# Patient Record
Sex: Male | Born: 1980 | Hispanic: Yes | Marital: Single | Smoking: Never smoker
Health system: Southern US, Community
[De-identification: ages and names within clinical notes are randomized; demographics above are authoritative.]

## PROBLEM LIST (undated history)

## (undated) ENCOUNTER — Ambulatory Visit (HOSPITAL_COMMUNITY): Admission: EM | Payer: PRIVATE HEALTH INSURANCE | Source: Home / Self Care

---

## 2010-09-10 ENCOUNTER — Emergency Department (HOSPITAL_COMMUNITY)
Admission: EM | Admit: 2010-09-10 | Discharge: 2010-09-10 | Disposition: A | Discharge status: Home / Self Care | Payer: Self-pay | Attending: Emergency Medicine | Admitting: Emergency Medicine

## 2010-09-10 DIAGNOSIS — R1013 Epigastric pain: Secondary | ICD-10-CM | POA: Insufficient documentation

## 2010-09-10 DIAGNOSIS — K2970 Gastritis, unspecified, without bleeding: Secondary | ICD-10-CM | POA: Insufficient documentation

## 2010-09-12 ENCOUNTER — Emergency Department (HOSPITAL_COMMUNITY): Payer: Self-pay

## 2010-09-12 ENCOUNTER — Emergency Department (HOSPITAL_COMMUNITY)
Admission: EM | Admit: 2010-09-12 | Discharge: 2010-09-12 | Disposition: A | Discharge status: Home / Self Care | Payer: Self-pay | Attending: Emergency Medicine | Admitting: Emergency Medicine

## 2010-09-12 DIAGNOSIS — R110 Nausea: Secondary | ICD-10-CM | POA: Insufficient documentation

## 2010-09-12 DIAGNOSIS — R1013 Epigastric pain: Secondary | ICD-10-CM | POA: Insufficient documentation

## 2010-09-12 LAB — CBC
MCHC: 34.8 g/dL (ref 30.0–36.0)
RDW: 13.2 % (ref 11.5–15.5)
WBC: 17.1 10*3/uL — ABNORMAL HIGH (ref 4.0–10.5)

## 2010-09-12 LAB — COMPREHENSIVE METABOLIC PANEL
ALT: 42 U/L (ref 0–53)
AST: 30 U/L (ref 0–37)
Albumin: 3.7 g/dL (ref 3.5–5.2)
Calcium: 8.5 mg/dL (ref 8.4–10.5)
GFR calc Af Amer: >60 mL/min (ref >60)
Potassium: 4.2 mEq/L (ref 3.5–5.1)
Sodium: 136 mEq/L (ref 135–145)
Total Protein: 7.1 g/dL (ref 6.0–8.3)

## 2010-09-12 LAB — URINALYSIS, ROUTINE W REFLEX MICROSCOPIC
Bilirubin Urine: NEGATIVE
Ketones, ur: NEGATIVE mg/dL
Nitrite: NEGATIVE
Specific Gravity, Urine: 1.017 (ref 1.005–1.030)
Urobilinogen, UA: 0.2 mg/dL (ref 0.0–1.0)
pH: 6 (ref 5.0–8.0)

## 2010-09-12 LAB — DIFFERENTIAL
Basophils Absolute: 0 10*3/uL (ref 0.0–0.1)
Basophils Relative: 0 % (ref 0–1)
Eosinophils Relative: 4 % (ref 0–5)
Monocytes Absolute: 1.1 10*3/uL — ABNORMAL HIGH (ref 0.1–1.0)
Neutro Abs: 11.7 10*3/uL — ABNORMAL HIGH (ref 1.7–7.7)

## 2010-09-12 LAB — LIPASE, BLOOD: Lipase: 27 U/L (ref 11–59)

## 2010-09-12 MED ORDER — IOHEXOL 300 MG/ML  SOLN
80.0000 mL | Freq: Once | INTRAMUSCULAR | Status: AC | PRN
  Administered 2010-09-12: 80 mL via INTRAVENOUS

## 2010-09-14 ENCOUNTER — Emergency Department (HOSPITAL_COMMUNITY): Payer: Medicaid Other

## 2010-09-14 ENCOUNTER — Inpatient Hospital Stay (HOSPITAL_COMMUNITY)
Admission: EM | Admit: 2010-09-14 | Discharge: 2010-09-16 | DRG: 394 | Disposition: A | Discharge status: Home / Self Care | Payer: Medicaid Other | Attending: Internal Medicine | Admitting: Internal Medicine

## 2010-09-14 DIAGNOSIS — D72829 Elevated white blood cell count, unspecified: Secondary | ICD-10-CM | POA: Diagnosis present

## 2010-09-14 DIAGNOSIS — I880 Nonspecific mesenteric lymphadenitis: Secondary | ICD-10-CM | POA: Diagnosis present

## 2010-09-14 DIAGNOSIS — E871 Hypo-osmolality and hyponatremia: Secondary | ICD-10-CM | POA: Diagnosis present

## 2010-09-14 DIAGNOSIS — K6389 Other specified diseases of intestine: Principal | ICD-10-CM | POA: Diagnosis present

## 2010-09-14 LAB — COMPREHENSIVE METABOLIC PANEL
ALT: 43 U/L (ref 0–53)
Alkaline Phosphatase: 127 U/L — ABNORMAL HIGH (ref 39–117)
CO2: 25 mEq/L (ref 19–32)
Calcium: 8.9 mg/dL (ref 8.4–10.5)
GFR calc non Af Amer: >60 mL/min (ref >60)
Glucose, Bld: 99 mg/dL (ref 70–99)
Potassium: 3.6 mEq/L (ref 3.5–5.1)
Sodium: 133 mEq/L — ABNORMAL LOW (ref 135–145)

## 2010-09-14 LAB — CBC
HCT: 42.5 % (ref 39.0–52.0)
Hemoglobin: 14.9 g/dL (ref 13.0–17.0)
MCHC: 35.1 g/dL (ref 30.0–36.0)
MCV: 88.7 fL (ref 78.0–100.0)

## 2010-09-14 LAB — LIPASE, BLOOD: Lipase: 25 U/L (ref 11–59)

## 2010-09-14 LAB — URINALYSIS, ROUTINE W REFLEX MICROSCOPIC
Bilirubin Urine: NEGATIVE
Glucose, UA: NEGATIVE mg/dL
Hgb urine dipstick: NEGATIVE
Nitrite: NEGATIVE
Specific Gravity, Urine: 1.01 (ref 1.005–1.030)
pH: 7 (ref 5.0–8.0)

## 2010-09-14 LAB — DIFFERENTIAL
Basophils Absolute: 0 10*3/uL (ref 0.0–0.1)
Lymphocytes Relative: 17 % (ref 12–46)
Lymphs Abs: 2.5 10*3/uL (ref 0.7–4.0)
Monocytes Absolute: 0.8 10*3/uL (ref 0.1–1.0)
Neutro Abs: 10.7 10*3/uL — ABNORMAL HIGH (ref 1.7–7.7)

## 2010-09-15 ENCOUNTER — Emergency Department (HOSPITAL_COMMUNITY): Payer: Medicaid Other

## 2010-09-15 ENCOUNTER — Inpatient Hospital Stay (HOSPITAL_COMMUNITY): Payer: Medicaid Other

## 2010-09-15 DIAGNOSIS — R1013 Epigastric pain: Secondary | ICD-10-CM

## 2010-09-15 DIAGNOSIS — I880 Nonspecific mesenteric lymphadenitis: Secondary | ICD-10-CM

## 2010-09-15 DIAGNOSIS — K6389 Other specified diseases of intestine: Secondary | ICD-10-CM

## 2010-09-15 LAB — CBC
Platelets: 415 10*3/uL — ABNORMAL HIGH (ref 150–400)
RDW: 13.3 % (ref 11.5–15.5)
WBC: 11.4 10*3/uL — ABNORMAL HIGH (ref 4.0–10.5)

## 2010-09-15 LAB — HEPATIC FUNCTION PANEL
ALT: 37 U/L (ref 0–53)
AST: 25 U/L (ref 0–37)
Indirect Bilirubin: 0.5 mg/dL (ref 0.3–0.9)
Total Protein: 6.2 g/dL (ref 6.0–8.3)

## 2010-09-15 LAB — DIFFERENTIAL
Basophils Absolute: 0 10*3/uL (ref 0.0–0.1)
Eosinophils Absolute: 0.5 10*3/uL (ref 0.0–0.7)
Eosinophils Relative: 5 % (ref 0–5)

## 2010-09-15 LAB — BASIC METABOLIC PANEL
BUN: 5 mg/dL — ABNORMAL LOW (ref 6–23)
GFR calc non Af Amer: >60 mL/min (ref >60)
Potassium: 3.9 mEq/L (ref 3.5–5.1)
Sodium: 137 mEq/L (ref 135–145)

## 2010-09-16 ENCOUNTER — Inpatient Hospital Stay (HOSPITAL_COMMUNITY): Payer: Medicaid Other

## 2010-09-16 LAB — COMPREHENSIVE METABOLIC PANEL
ALT: 34 U/L (ref 0–53)
BUN: 5 mg/dL — ABNORMAL LOW (ref 6–23)
CO2: 28 mEq/L (ref 19–32)
Calcium: 8.7 mg/dL (ref 8.4–10.5)
Creatinine, Ser: 0.65 mg/dL (ref 0.4–1.5)
GFR calc non Af Amer: >60 mL/min (ref >60)
Glucose, Bld: 80 mg/dL (ref 70–99)
Sodium: 137 mEq/L (ref 135–145)
Total Protein: 6.2 g/dL (ref 6.0–8.3)

## 2010-09-16 LAB — DIFFERENTIAL
Basophils Absolute: 0 10*3/uL (ref 0.0–0.1)
Eosinophils Relative: 10 % — ABNORMAL HIGH (ref 0–5)
Lymphocytes Relative: 39 % (ref 12–46)
Lymphs Abs: 3.3 10*3/uL (ref 0.7–4.0)
Monocytes Absolute: 0.6 10*3/uL (ref 0.1–1.0)
Monocytes Relative: 7 % (ref 3–12)
Neutro Abs: 3.8 10*3/uL (ref 1.7–7.7)

## 2010-09-16 LAB — CBC
HCT: 42 % (ref 39.0–52.0)
Hemoglobin: 14.7 g/dL (ref 13.0–17.0)
MCH: 31.4 pg (ref 26.0–34.0)
MCHC: 35 g/dL (ref 30.0–36.0)
MCV: 89.7 fL (ref 78.0–100.0)
RDW: 13.6 % (ref 11.5–15.5)

## 2010-09-16 LAB — MAGNESIUM: Magnesium: 1.8 mg/dL (ref 1.5–2.5)

## 2010-09-16 LAB — PHOSPHORUS: Phosphorus: 4.2 mg/dL (ref 2.3–4.6)

## 2010-09-16 LAB — T4, FREE: Free T4: 1.4 ng/dL (ref 0.80–1.80)

## 2010-09-24 NOTE — Consult Note (Signed)
NAMEKENAI, FLUEGEL                  ACCOUNT NO.:  [id number]  MEDICAL RECORD NO.:  [id number]           PATIENT TYPE:  I  LOCATION:  5524                         FACILITY:  [facility]  PHYSICIAN:  Riyanna Crutchley A. Ryheem Jay, M.D.DATE OF BIRTH:  [birth year]  DATE OF CONSULTATION: DATE OF DISCHARGE:                                CONSULTATION   PHYSICIAN REQUESTING CONSULTATION:  Dr. Clarene Duke.  REASON FOR CONSULTATION:  Abdominal pain, history of right colon pneumatosis, and mesenteric adenitis  HISTORY OF PRESENT ILLNESS:  The patient is a 30 year old male who was here 2 days ago in the emergency room due to epigastric pain.  CT scan was obtained which showed right colonic pneumatosis and this analysis was felt to be a benign finding and mesenteric adenitis.  He was sent home on Carafate and some other GI cocktail meds.  He returned with continued epigastric pain.  The pain is located in his epigastrium which is at the base of the sternum.  Pain is constant in nature.  Made worse with eating.  Also nausea noted.  The pain is probably 7 or 8/10. Repeat plain films today showed pneumatosis of right colon which is stable with no free air.  I was asked to consult with respect to his pneumatosis and his abdominal pain.  He denies any right lower quadrant pain, any back pain, or any change in bowel or bladder function.  PAST MEDICAL HISTORY:  None.  PAST SURGICAL HISTORY:  None.  FAMILY HISTORY:  Noncontributory.  SOCIAL HISTORY:  Denies tobacco or alcohol use.  MEDICATIONS:  Currently Carafate.  REVIEW OF SYSTEMS:  As above, otherwise negative x15 points.  ALLERGIES:  No known drug allergies.  PHYSICAL EXAMINATION:  VITAL SIGNS:  Temperature 97, pulse 79, blood pressure 110/64, respiratory rate 20. GENERAL APPEARANCE:  Male in no apparent distress. HEENT:  No jaundice.  Oropharynx moist. NECK:  Supple, nontender.  Trachea midline. PULMONARY:  Lung sounds are clear.  Chest wall motion  normal. CARDIOVASCULAR:  Regular rate and rhythm without rub, murmur, or gallop. ABDOMEN:  Tenderness is right in his epigastrium.  Negative Murphy sign. No right lower quadrant tenderness.  No tenderness over McBurney's point.  No hernia.  No mass or phlegmon. EXTREMITIES:  No clubbing, cyanosis, or edema.  Extremities are warm and well-perfused. NEURO:  Glasgow coma scale is 15.  Motor and sensory function are grossly intact.  Abdominal and pelvic CT scan reviewed from September 12, 2010 which shows pneumatosis to the right colon.  There is significant mesenteric adenitis.  Appendix is normal.  Gallbladder looked normal.  No free fluid.  No ascites.  White count of 14,200 today, was 17,000 on the 18th, hemoglobin 14.9, platelet count 445,000, no left shift.  Sodium 133, potassium 3.6, chloride 101, CO2 25, BUN 7, creatinine 0.54, glucose 99.  IMPRESSION: 1. Right colon pneumatosis. 2. Mesenteric adenitis 3. Epigastric abdominal pain.  DISCUSSION:  I think the pneumatosis is an incidental finding, is of no clinical significance at this point.  He does have mesenteric adenitis. As far as his epigastric pain, I think it requires further workup  with a gastroenterologist to exclude peptic ulcer disease, gastritis, duodenitis, or potentially biliary tract pain.  He is negative though in his right upper quadrant on examination.  Recommend medicine consultation, GI consultation.  At this point in time, I see no surgical intervention necessary.     Nilda Keathley A. Adalaya Irion, M.D.     TAC/MEDQ  D:  09/15/2010  T:  09/15/2010  Job:  [id number]  Electronically Signed by Harriette Bouillon M.D. on 09/24/2010 07:31:19 AM

## 2010-09-25 NOTE — Discharge Summary (Signed)
[name], ANZALONE                  ACCOUNT NO.:  [id number]  MEDICAL RECORD NO.:  [id number]           PATIENT TYPE:  I  LOCATION:  5524                         FACILITY:  [facility]  PHYSICIAN:  Rock Nephew, MD       DATE OF BIRTH:  1981-05-13  DATE OF ADMISSION:  09/14/2010 DATE OF DISCHARGE:  09/16/2010                        DISCHARGE SUMMARY - REFERRING   The patient has no primary care physician.  He is urged to establish care with a primary care physician.  Follow up with the primary care physician in 1 week.  DISCHARGE DIAGNOSES: 1. Mesenteric adenitis, resolving. 2. Pneumatosis intestinalis. 3. Abdominal pain from mesenteric adenitis and pneumatosis     intestinalis, resolved. 4. Leukocytosis, resolved.  The patient's discharge medications are as follows:  Vicodin 1 tablet by mouth every 6 hours as needed for pain.  The patient's diet is regular.  FOLLOWUP:  The patient should follow up with Dr. Leone Payor in about 2 weeks.  They will try to arrange an outpatient followup for the patient. If Dr. Marvell Fuller office has not called the patient, the patient is instructed to call them as the patient does need to follow up with them.  CONSULTATIONS ON THIS CASE:  Iva Boop, MD, Clementeen Graham, [facility].  PROCEDURES PERFORMED:  The patient had a CT scan of the abdomen and pelvis before the admission on September 12, 2010 in the emergency department, which showed circumferential pneumatosis involving the right colon, likely benign etiology.  There is no associated bowel wall thickening or extraluminal air-fluid collection.  Too numerous prominent mesenteric lymph nodes suspicious for mesenteric adenitis.  Early proliferative process cannot be excluded and requires clinical correlation.  Mild periportal edema within the liver; this can be seen with aggressive intravenous hydration.  The patient had an acute abdominal series on September 14, 2010, which showed stable right  colonic pneumatosis.  Acute abdominal series on September 15, 2010 showed nonobstructive bowel gas pattern.  No active cardiopulmonary disease. The patient had two-view abdominal x-ray on September 16, 2010, which showed nonspecific, nonobstructive bowel-gas pattern.  No evidence of pneumoperitoneum.  BRIEF HISTORY OF PRESENT ILLNESS:  This is a 30 year old male, mainly Spanish-speaking presents to the emergency department complaining of epigastric abdominal pain radiating to the back for the past 5 days.  He states that the patient is about 10/10 intensity.  The patient has been unrelieved by any therapy.  The patient went to the emergency department 3 times this week.  HOSPITAL COURSE: 1. Mesenteric adenitis.  The patient had multiple radiological     studies.  The patient initially was n.p.o. then on a clear liquid     diet.  The patient was seen by [facility] and was a nonspecific     finding.  The patient was initially placed on Flagyl; however,     speaking to Dr. Leone Payor, he did not think that the patient needed     any Flagyl or any PPIs. 2. Pneumatosis intestinalis.  Initially was thought it might be     infectious; however, later Dr. Leone Payor did not think it was  infectious or was a incidental finding. 3. Abdominal pain.  Abdominal pain was most likely secondary to     mesenteric adenitis and pneumatosis intestinalis.  Abdominal pain     has resolved, and the patient is deemed ready for discharge. 4. Leukocytosis.  The patient initially had some mild leukocytosis on     admission, 14.2.  Currently, the patient's WBC count is 8.6.     Rock Nephew, MD     NH/MEDQ  D:  09/16/2010  T:  09/16/2010  Job:  [id number]  cc:   Iva Boop, MD,FACG  Electronically Signed by Rock Nephew MD on 09/25/2010 09:00:25 PM

## 2010-10-06 NOTE — H&P (Signed)
[name], [name]                  ACCOUNT NO.:  [id number]  MEDICAL RECORD NO.:  [id number]           PATIENT TYPE:  I  LOCATION:  5524                         FACILITY:  [facility]  PHYSICIAN:  Della Goo, M.D. DATE OF BIRTH:  05-05-1981  DATE OF ADMISSION:  09/15/2010 DATE OF DISCHARGE:                             HISTORY & PHYSICAL   PRIMARY CARE PHYSICIAN:  Unassigned.  Please note that a translation phone was used.  The patient is Spanish speaking mainly with limited [place].  He is a 30 year old male who presents to the emergency department with complaints of epigastric abdominal pain radiating into the back for the past 5 days.  He states he has had 10/10 pain.  The pain has been burning pain and has been unrelieved by any therapy that has been given in the emergency department.  The patient has been seen 3 times this week.  He was first seen on Monday, then Wednesday and returned today on September 14, 2010.  He was evaluated in the emergency department and referred for medical admission after his pain was unrelieved.  In the emergency department, the patient had a repeat acute abdominal series performed, results of which revealed stable right colonic pneumatosis.  A CAT scan of the abdomen had been performed on Wednesday, September 12, 2010, which revealed the right-sided colonic pneumatosis.  A general surgery consultation was placed by the EDP and Dr. Luisa Hart was the consultant on-call who saw the patient in the emergency department. There was no evidence of free air on the imaging studies.  The patient has no history of surgery or instrumentation.  The patient also denied having any symptoms of nausea, vomiting, or diarrhea.  He denies having any black tarry stools and denies having any hematemesis.  He also denies having any fevers or chills.  The patient states that he previously had been healthy up until all this.  PAST MEDICAL HISTORY:  None.  PAST SURGICAL HISTORY:   None.  MEDICATIONS:  None.  ALLERGIES:  No known drug allergies.  SOCIAL HISTORY:  The patient is married.  He is a nonsmoker, nondrinker. Denies any illicit drug usage.  FAMILY HISTORY:  Negative for coronary artery disease, hypertension, diabetes, and cancer.  REVIEW OF SYSTEMS:  Pertinent as mentioned above in the HPI.  All other organ systems are negative.  PHYSICAL EXAMINATION FINDINGS:  GENERAL:  This is a 30 year old well- nourished, well-developed Hispanic male who is in no visible discomfort or acute distress currently. VITAL SIGNS:  Temperature 98.2, blood pressure 100/60, heart rate 59-79, respirations 20, and O2 sats 100%. HEENT:  Normocephalic and atraumatic.  Pupils are equally round and reactive to light.  Extraocular movements are intact.  Funduscopic benign.  There is no scleral icterus.  Nares are patent bilaterally. Oropharynx is clear. NECK:  Supple.  Full range of motion.  No thyromegaly, adenopathy, or jugular venous distention. CARDIOVASCULAR:  Regular rate and rhythm.  No murmurs, gallops, or rubs appreciated.  Normal S1 and S2. LUNGS:  Clear to auscultation bilaterally.  No rales, rhonchi, or wheezes.  Chest wall without any tenderness.  Normal  excursion which is symmetric and breathing is unlabored. ABDOMEN:  Positive bowel sounds, soft, mildly tender in the epigastrium, but no rebound, no guarding.  No hepatosplenomegaly.  No Murphy sign. EXTREMITIES:  Without cyanosis, clubbing, or edema. NEUROLOGIC:  Nonfocal.  LABORATORY STUDIES:  White blood cell count 14.2, hemoglobin 14.9, hematocrit 42.5, platelets 445, and neutrophils 75%.  Sodium 133, potassium 3.6, chloride 101, CO2 of 25, BUN 7, creatinine 0.54, and glucose is 99.  IMAGING STUDIES:  As mentioned above in the HPI.  ASSESSMENT:  This is a 30 year old male being admitted with: 1. Epigastric abdominal pain. 2. Colonic pneumatosis. 3. Mesenteric adenitis on CT scan. 4. Leukocytosis. 5.  Mild hyponatremia.  PLAN:  The patient will be admitted to Med Surgery Area.  Pain control therapy has been ordered along with clear liquids for a diet and IV fluids have been ordered for maintenance therapy.  The patient will be placed on IV Protonix therapy at this time and a repeat abdominal series will be performed in the a.m. and General Surgery will continue to follow this patient.  SCDs have been ordered for DVT prophylaxis.  At this point, the findings of the colonic pneumatosis has been reported as being stable and Radiology and General Surgery are of the opinion that this is a benign/incidental finding.     Della Goo, M.D.     HJ/MEDQ  D:  09/15/2010  T:  09/15/2010  Job:  [id number]  Electronically Signed by Della Goo M.D. on 10/06/2010 07:46:48 PM

## 2011-03-27 DIAGNOSIS — R55 Syncope and collapse: Secondary | ICD-10-CM

## 2012-04-26 IMAGING — CT CT ABD-PELV W/ CM
2 of 4 series · 17 of 46 positions shown, 19 images · IV contrast (APPLIED)
Comparison: None.

CLINICAL DATA: Epigastric pain for several days.Leukocytosis.

CT ABDOMEN AND PELVIS WITH CONTRAST
TECHNIQUE: Multidetector CT imaging of the abdomen and pelvis was
performed following the standard protocol during bolus
administration of intravenous contrast.
Contrast: 80 ml [id number] intravenously.

[Series 2: abd/pelv with 5.0 b31f st · axial · 0.60mm/px · z∈[-808,-384]mm · 14 of 93 slices shown, 16 images]
[im 4/93  soft-tissue]
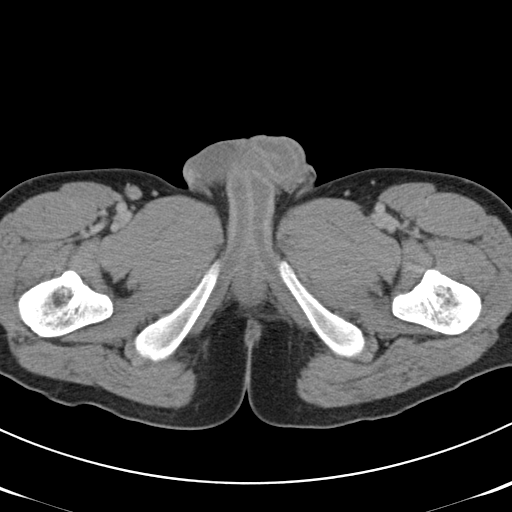
[im 4/93  bone]
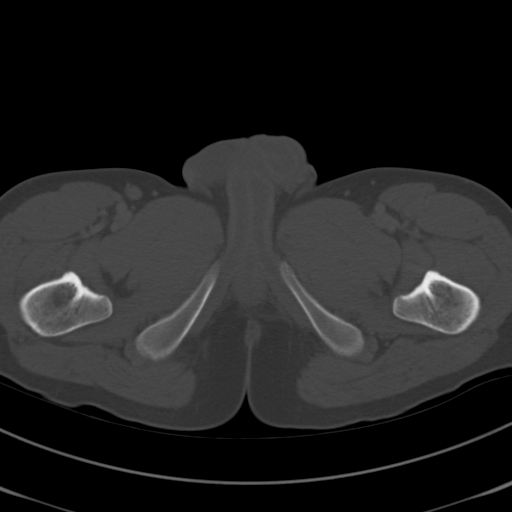
[im 11/93  soft-tissue]
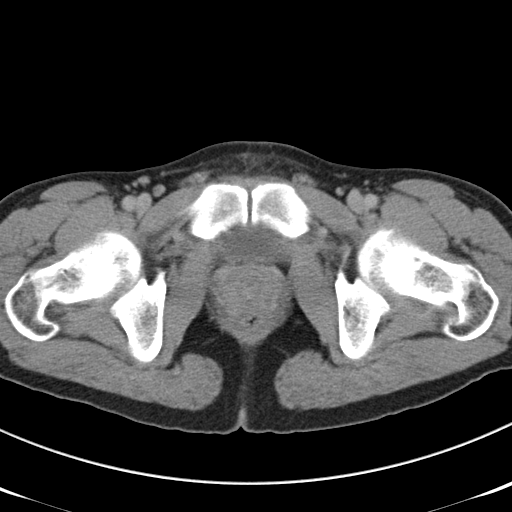
[im 18/93  soft-tissue]
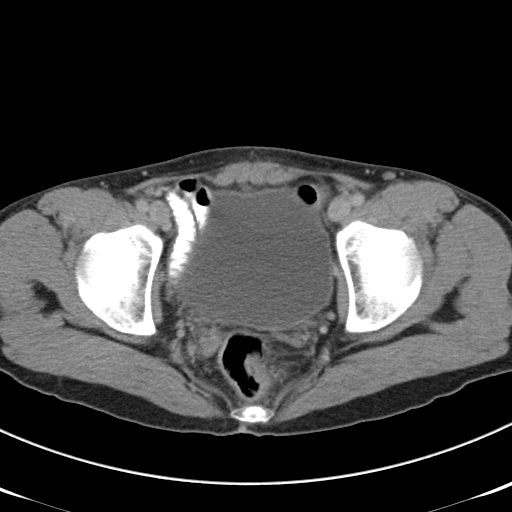
[im 24/93  soft-tissue]
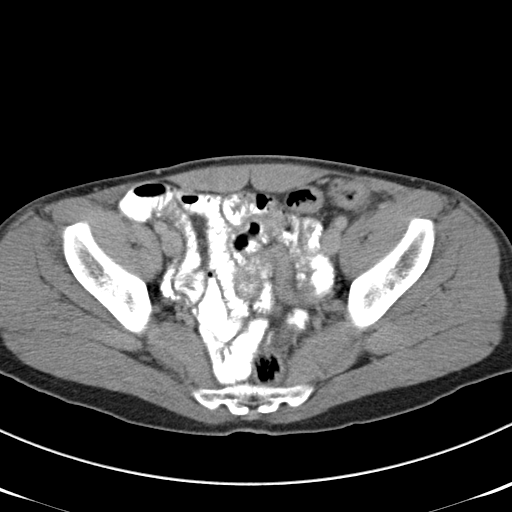
[im 31/93  soft-tissue]
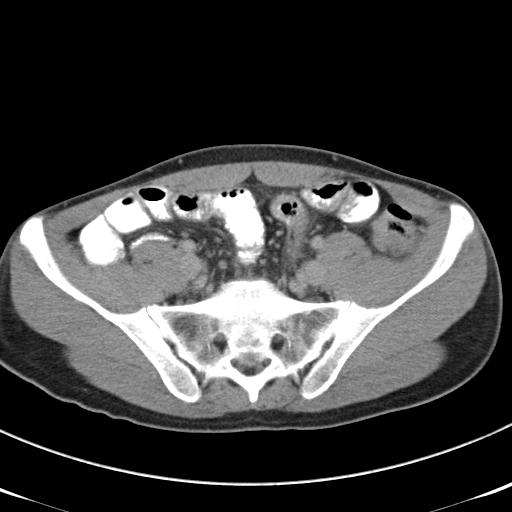
[im 38/93  soft-tissue]
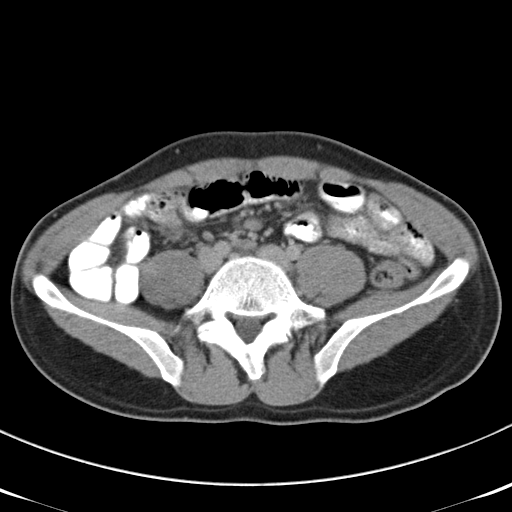
[im 45/93  soft-tissue]
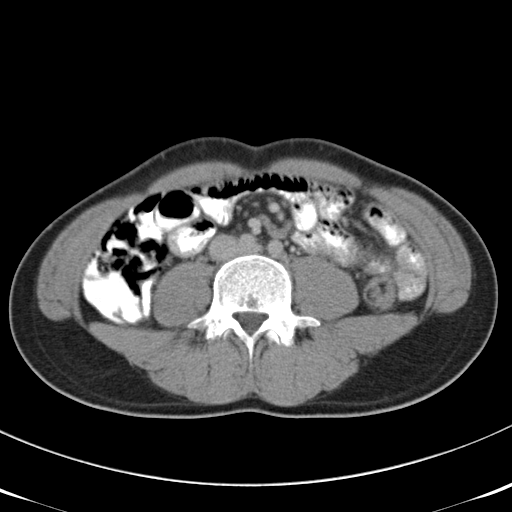
[im 48/93  soft-tissue]
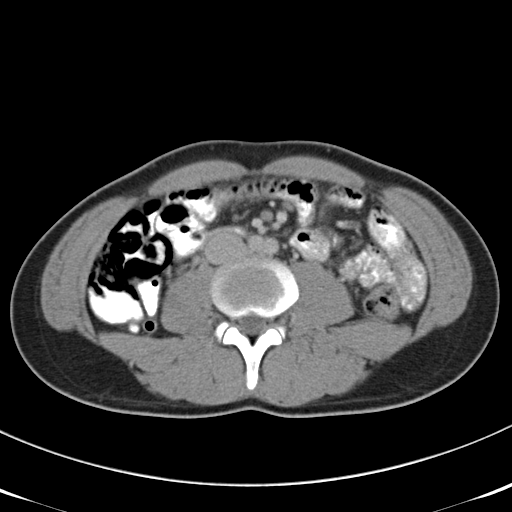
[im 55/93  soft-tissue]
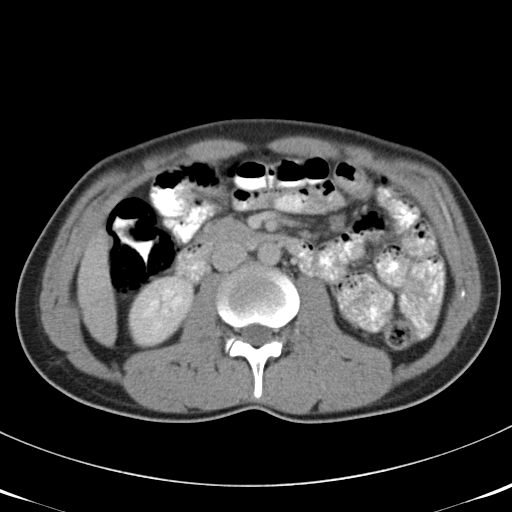
[im 55/93  bone]
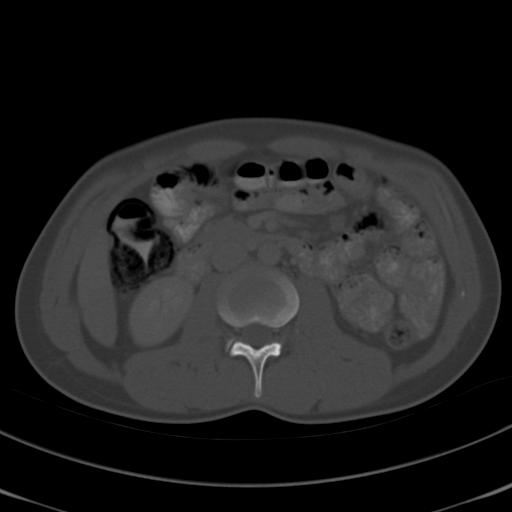
[im 62/93  soft-tissue]
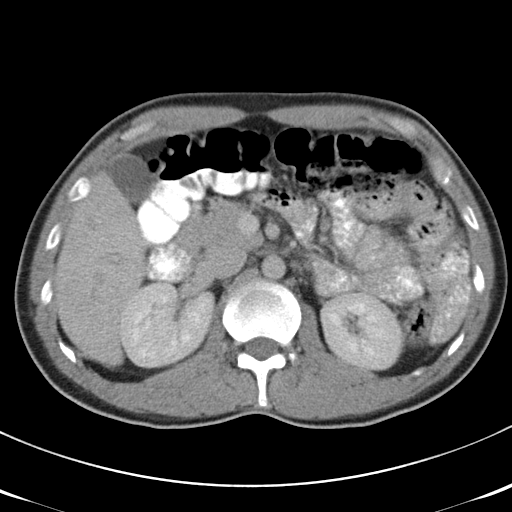
[im 69/93  soft-tissue]
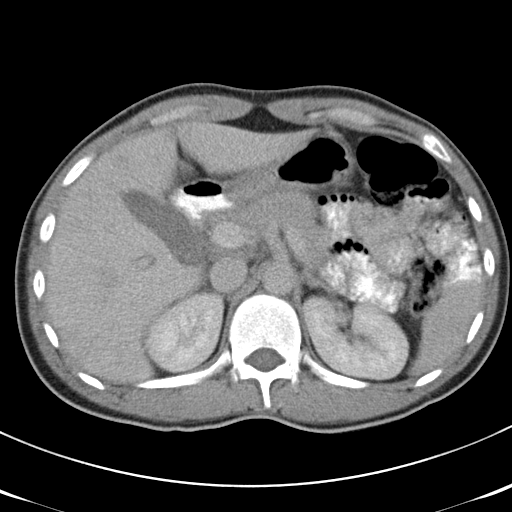
[im 75/93  soft-tissue]
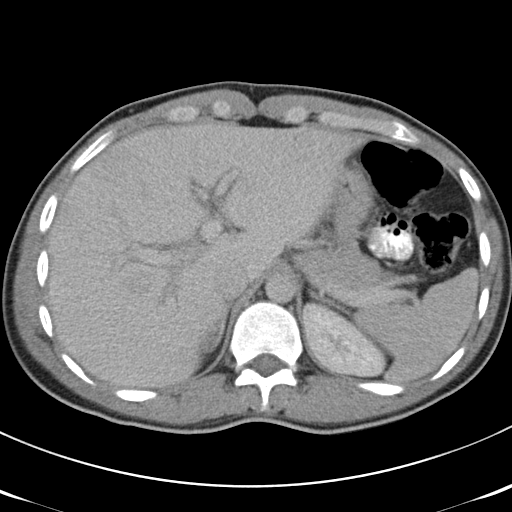
[im 82/93  soft-tissue]
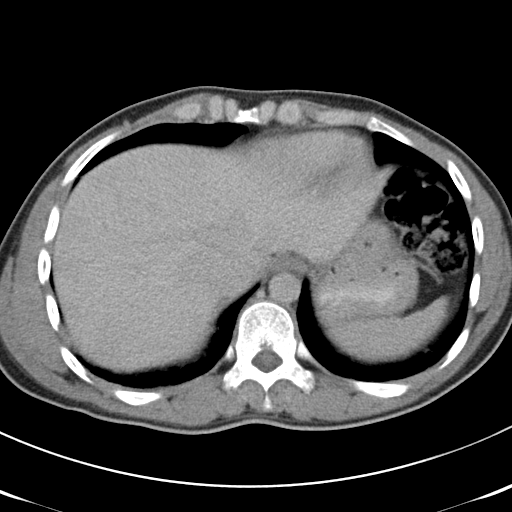
[im 89/93  soft-tissue]
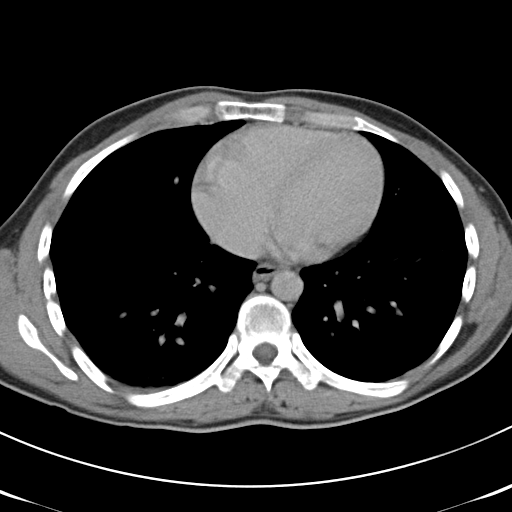

[Series 5: abd/pelv with 2.0 spo st · coronal · 0.90mm/px · 3 of 85 slices shown]
[im 29/85  soft-tissue]
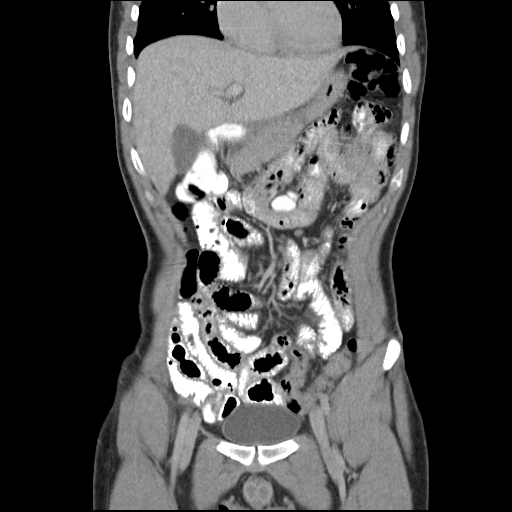
[im 38/85  soft-tissue]
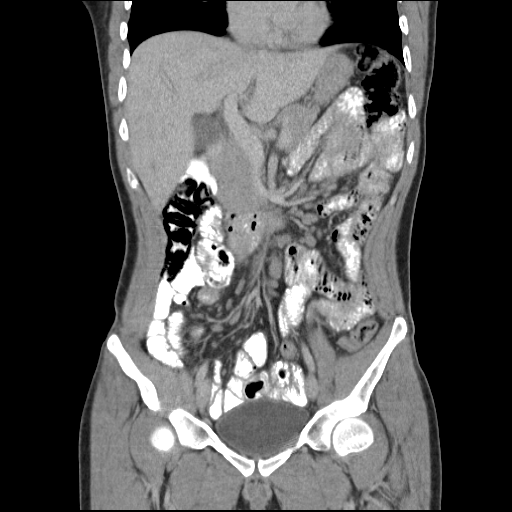
[im 47/85  soft-tissue]
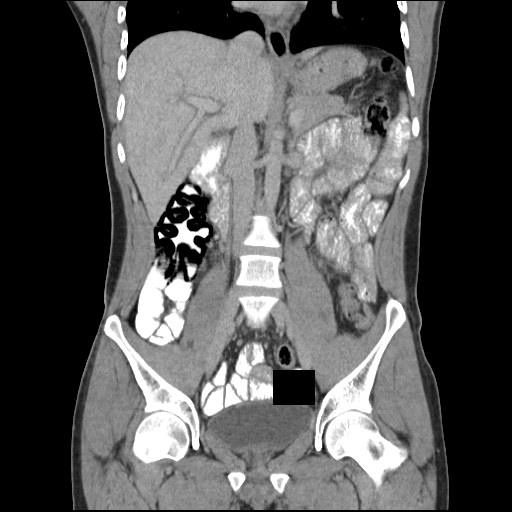

[17 of 46 positions shown; findings below may reference images not displayed]

FINDINGS: The lung bases are clear.  There is no pleural effusion.
The stomach is poorly distended and suboptimally evaluated.  The
bowel gas pattern is nonobstructive.  There appears to be
circumferential smooth pneumatosis involving the right colon,
sparing the cecum.  There is no bowel wall thickening.  The
appendix and distal small bowel appear normal. There are no
extraluminal air or fluid collections.

There are numerous prominent mesenteric lymph nodes.  The
individual nodes do not appear pathologically enlarged.  No
vascular abnormalities are seen.

There is mild periportal edema within the liver.  This may be
related to intravenous hydration.  There is no biliary dilatation
or focal hepatic abnormality.  The spleen, gallbladder, pancreas,
adrenal glands and kidneys appear normal.  Urinary bladder,
prostate gland and seminal vesicles appear normal.
IMPRESSION: 1.  Circumferential pneumatosis involving the right colon, likely
benign in etiology.  There is no associated bowel wall thickening
or extraluminal air fluid collection.
2.  Numerous prominent mesenteric lymph nodes suspicious for
mesenteric adenitis.  An early lymphoproliferative process cannot
be excluded and requires clinical correlation.
3.  Mild periportal edema within the liver; this can be seen with
aggressive intravenous hydration.

Results were discussed by telephone with Dr. Ceejay at 7814 hours on
09/12/2010.

## 2012-04-29 IMAGING — CR DG ABDOMEN ACUTE W/ 1V CHEST
3 series · 3 of 3 positions shown · non-contrast
Comparison: Yesterday

CLINICAL DATA: Abdominal pain

ACUTE ABDOMEN SERIES (ABDOMEN 2 VIEW & CHEST 1 VIEW)

[w chest pa]
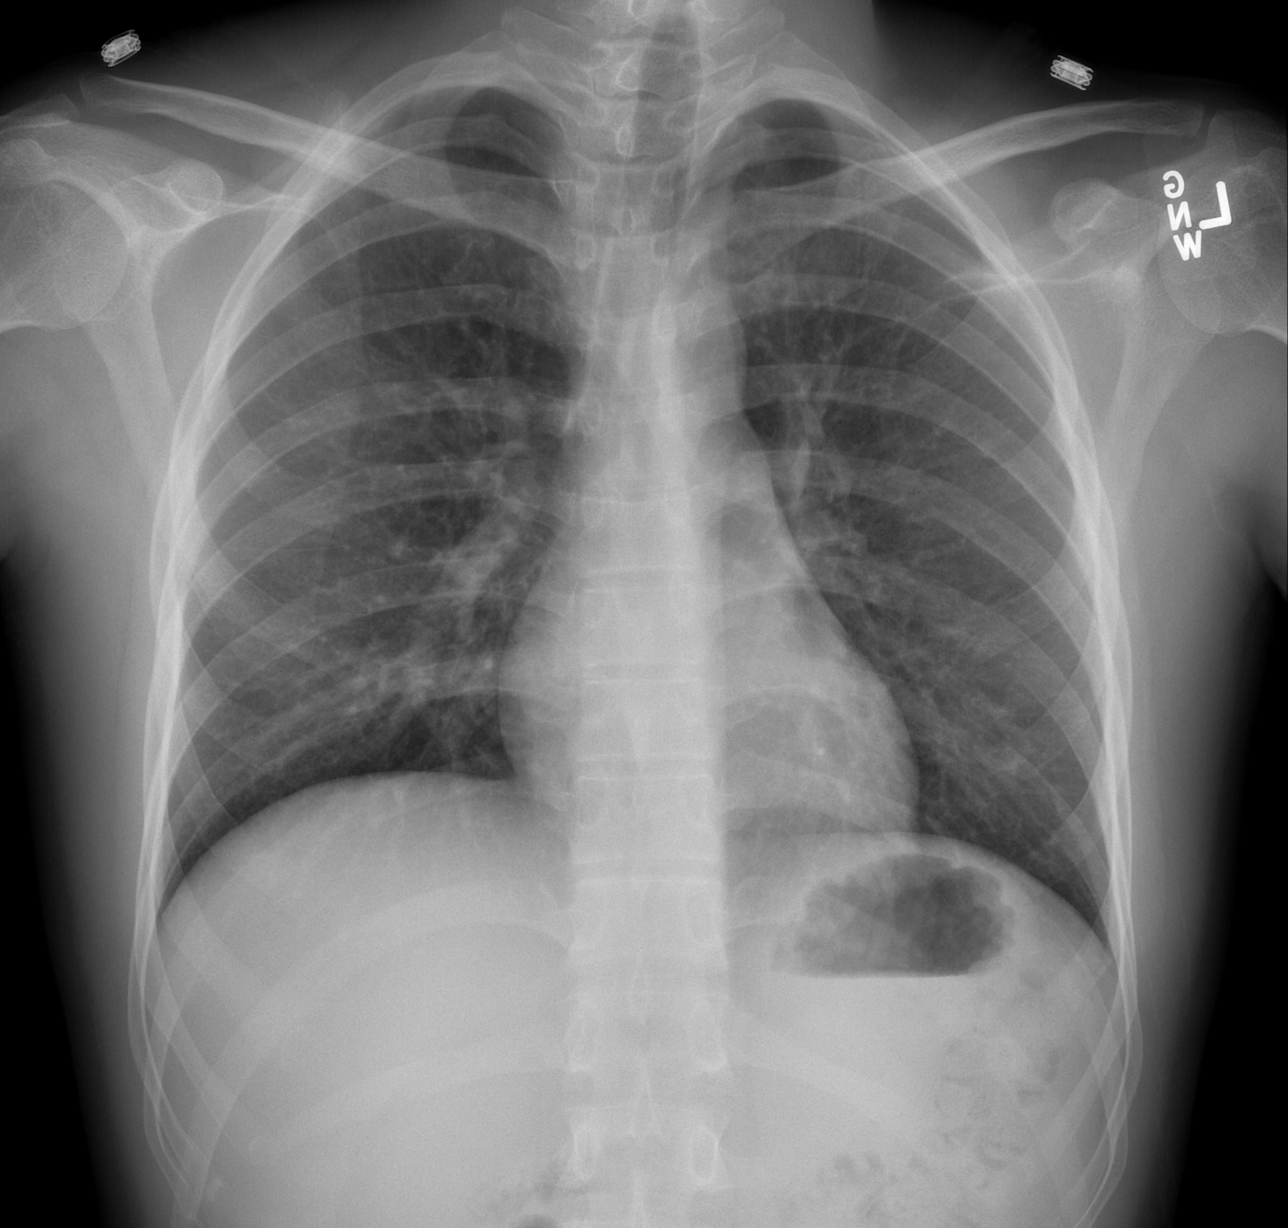

[w abdomen upright]
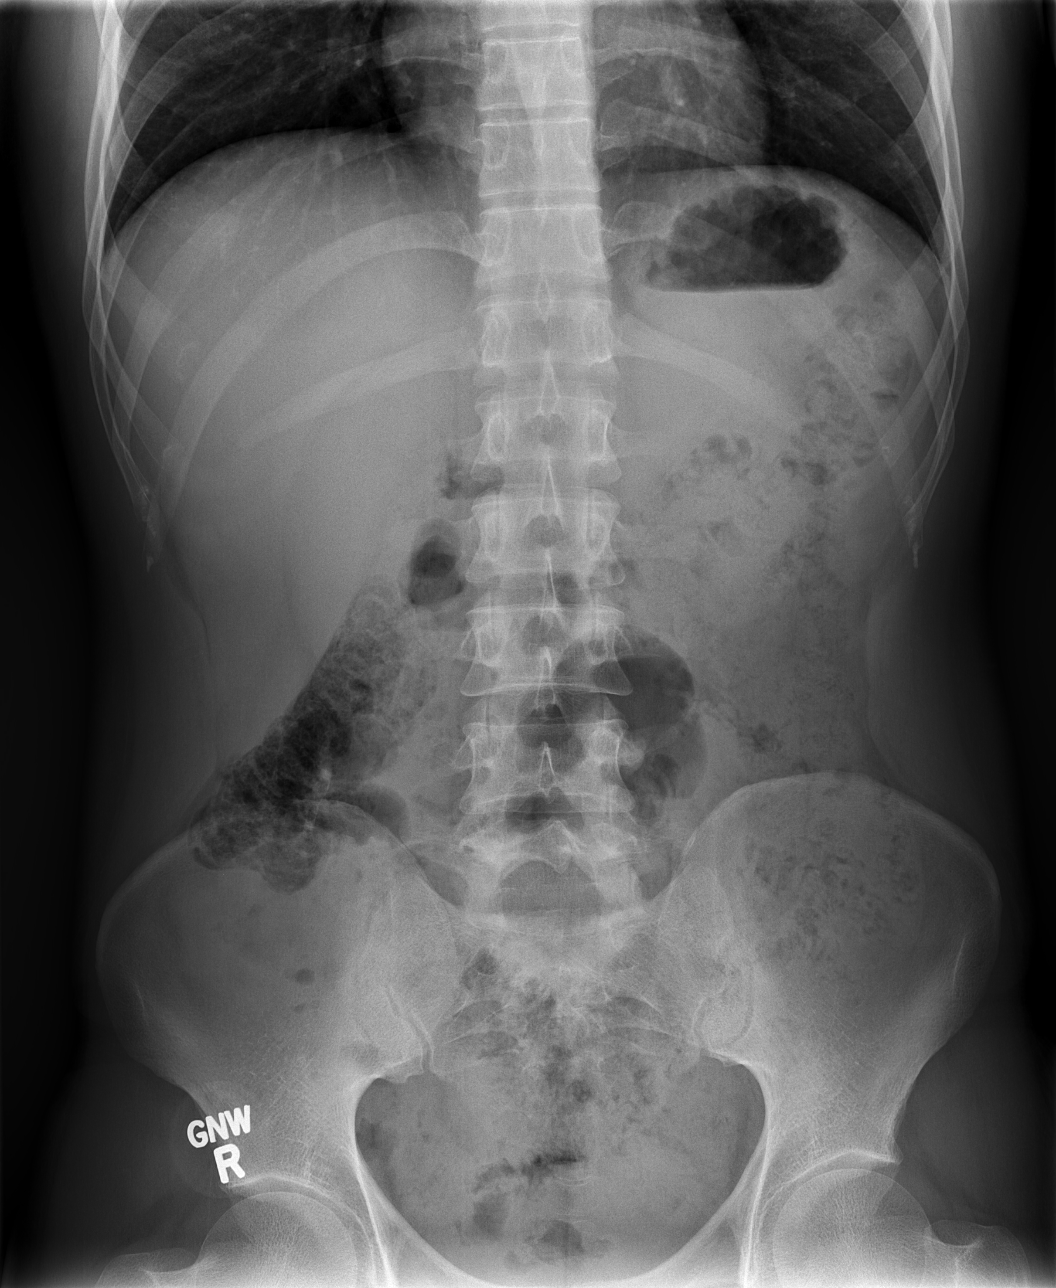

[t abdomen supine]
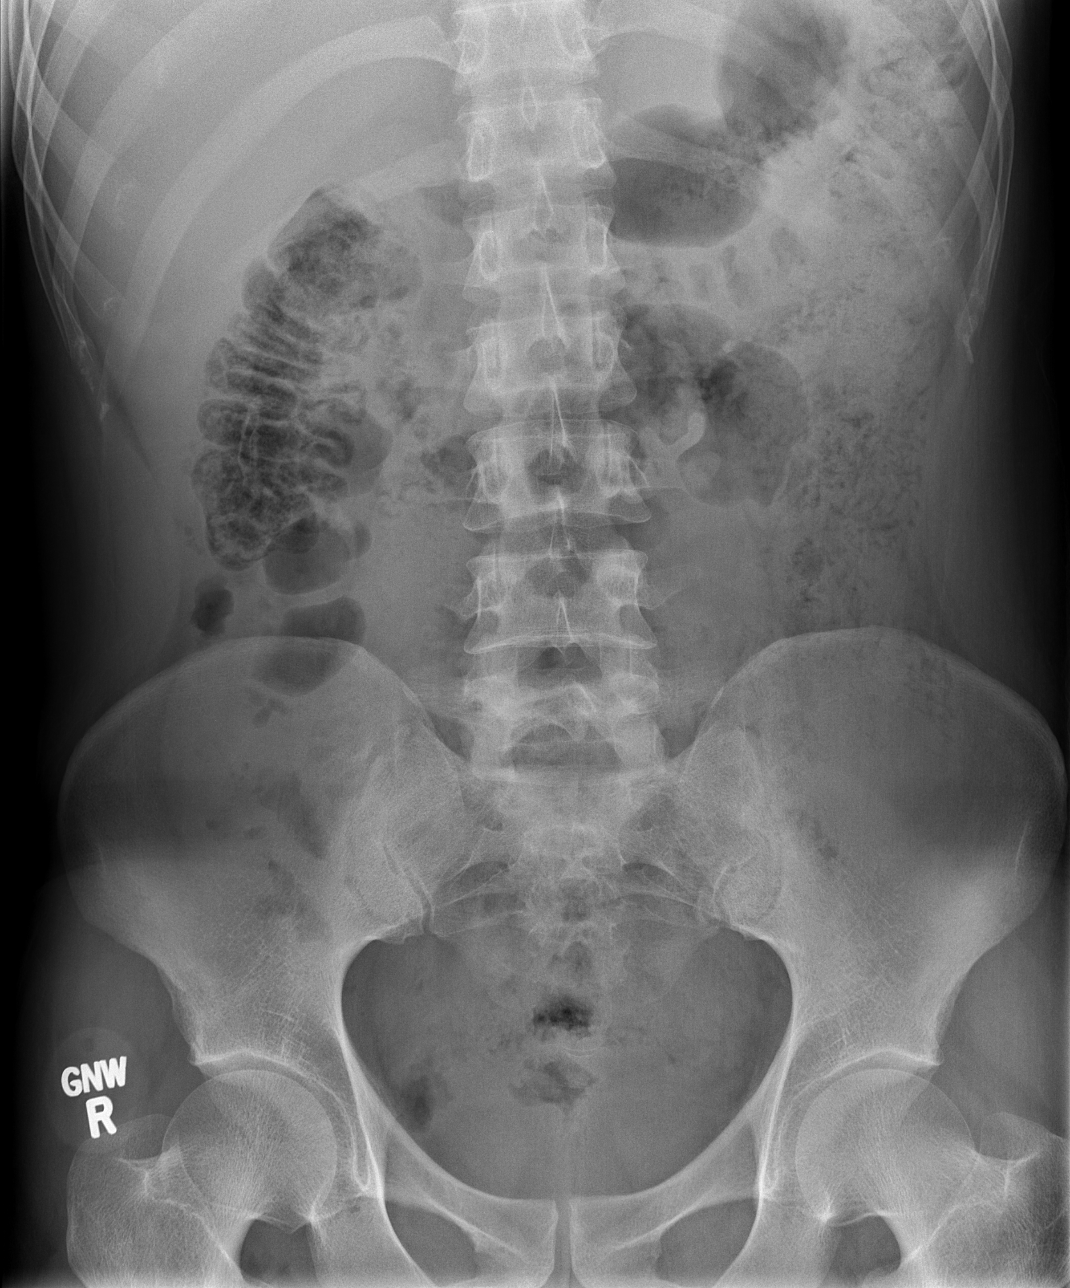

[3 of 3 positions shown; findings below may reference images not displayed]

FINDINGS: Heart is normal in size.   Lungs are clear.  No
pneumothorax.

Extensive stool throughout the colon.  No free intraperitoneal gas.
Unremarkable soft tissues. No disproportionate dilatation of bowel.
IMPRESSION: Nonobstructive bowel gas pattern.  No active cardiopulmonary
disease.

## 2014-07-05 ENCOUNTER — Encounter: Payer: Self-pay | Admitting: Family Medicine

## 2014-07-05 ENCOUNTER — Ambulatory Visit: Payer: No Typology Code available for payment source | Attending: Family Medicine | Admitting: Family Medicine

## 2014-07-05 VITALS — BP 95/64 | HR 78 | Temp 98.9°F | Resp 16 | Ht 64.0 in | Wt 117.0 lb

## 2014-07-05 DIAGNOSIS — R946 Abnormal results of thyroid function studies: Secondary | ICD-10-CM | POA: Insufficient documentation

## 2014-07-05 DIAGNOSIS — Z23 Encounter for immunization: Secondary | ICD-10-CM

## 2014-07-05 DIAGNOSIS — S025XXA Fracture of tooth (traumatic), initial encounter for closed fracture: Secondary | ICD-10-CM | POA: Insufficient documentation

## 2014-07-05 DIAGNOSIS — Z113 Encounter for screening for infections with a predominantly sexual mode of transmission: Secondary | ICD-10-CM

## 2014-07-05 DIAGNOSIS — R7989 Other specified abnormal findings of blood chemistry: Secondary | ICD-10-CM | POA: Insufficient documentation

## 2014-07-05 DIAGNOSIS — K011 Impacted teeth: Secondary | ICD-10-CM | POA: Insufficient documentation

## 2014-07-05 DIAGNOSIS — S025XXB Fracture of tooth (traumatic), initial encounter for open fracture: Secondary | ICD-10-CM

## 2014-07-05 DIAGNOSIS — Z114 Encounter for screening for human immunodeficiency virus [HIV]: Secondary | ICD-10-CM | POA: Insufficient documentation

## 2014-07-05 DIAGNOSIS — K088 Other specified disorders of teeth and supporting structures: Secondary | ICD-10-CM | POA: Insufficient documentation

## 2014-07-05 LAB — CBC
HCT: 47.2 % (ref 39.0–52.0)
Hemoglobin: 16 g/dL (ref 13.0–17.0)
MCH: 30.5 pg (ref 26.0–34.0)
MCHC: 33.9 g/dL (ref 30.0–36.0)
MCV: 89.9 fL (ref 78.0–100.0)
MPV: 8.8 fL (ref 8.6–12.4)
Platelets: 504 10*3/uL — ABNORMAL HIGH (ref 150–400)
RBC: 5.25 MIL/uL (ref 4.22–5.81)
RDW: 14.6 % (ref 11.5–15.5)
WBC: 11.1 10*3/uL — ABNORMAL HIGH (ref 4.0–10.5)

## 2014-07-05 NOTE — Assessment & Plan Note (Addendum)
R lower 3rd molar impacted, L lower incisor chipped. Dental referral placed.  You will be called.

## 2014-07-05 NOTE — Assessment & Plan Note (Signed)
R lower 3rd molar impacted, L lower incisor chipped. Dental referral placed.  You will be called.    

## 2014-07-05 NOTE — Progress Notes (Signed)
   Subjective:    Patient ID: [name], male    DOB: 04/27/1981, 34 y.o.   MRN: [id number] CC: establish care, dental referral  HPI 34 yo M new patient: Spanish interpreter present    1. Dental referral: pain on R lower molar. Chipped incision L lower. No fever or swelling.   2. Low TSH: noted in 08/2010 with normal free T4. He denies fever, weight oss, diarrhea, constipation, resting tremor, diarrhea or constipation.   Soc Hx: non smoker  Med Hx: low TSH noted in 2012, normal free T4 Surg Hx: negative  Review of Systems As per HPI     Objective:   Physical Exam BP 95/64 mmHg  Pulse 78  Temp(Src) 98.9 F (37.2 C)  Resp 16  Ht 5\' 4"  (1.626 m)  Wt 117 lb (53.071 kg)  BMI 20.07 kg/m2  SpO2 93% General appearance: alert, cooperative and no distress,thin hispanic male  Eyes: conjunctivae/corneas clear. PERRL, EOM's intact. Throat: partially, impacted R third molar lower. Chipped incisor L lower. Otherwise normal.  Neck: no adenopathy, supple, symmetrical, trachea midline and thyroid not enlarged, symmetric, no tenderness/mass/nodules Lungs: clear to auscultation bilaterally Heart: regular rate and rhythm, S1, S2 normal, no murmur, click, rub or gallop Extremities: extremities normal, atraumatic, no cyanosis or edema Neuro: no resting tremor     Assessment & Plan:

## 2014-07-05 NOTE — Progress Notes (Signed)
Patient here to establish care He is requesting dental referral for broken tooth Patient will take flu shot

## 2014-07-05 NOTE — Assessment & Plan Note (Signed)
Hx of low TSH: Repeat TSH today Exam is normal.

## 2014-07-05 NOTE — Patient Instructions (Addendum)
Mr. [name],  Thank you for coming in today. It was a pleasure meeting you. I look forward to being your primary doctor.  1. R lower 3rd molar impacted, L lower incisor chipped. Dental referral placed.  You will be called.   2. Hx of low TSH: Repeat TSH today Exam is normal.   Also drew CBC, CMP, HIV, cholestrol-routine labs.  You will be called with lab results  F/u in 6 months for f/u dental exam, sooner if needed.   Dr. Armen PickupFunches

## 2014-07-05 NOTE — Assessment & Plan Note (Signed)
Screening HIV today  

## 2014-07-06 LAB — COMPLETE METABOLIC PANEL WITH GFR
ALBUMIN: 4.6 g/dL (ref 3.5–5.2)
ALK PHOS: 119 U/L — AB (ref 39–117)
ALT: 42 U/L (ref 0–53)
AST: 30 U/L (ref 0–37)
BUN: 17 mg/dL (ref 6–23)
CO2: 28 meq/L (ref 19–32)
Calcium: 9.6 mg/dL (ref 8.4–10.5)
Chloride: 101 mEq/L (ref 96–112)
Creat: 0.65 mg/dL (ref 0.50–1.35)
GFR, Est Non African American: >89 mL/min
GLUCOSE: 75 mg/dL (ref 70–99)
POTASSIUM: 4.9 meq/L (ref 3.5–5.3)
SODIUM: 137 meq/L (ref 135–145)
TOTAL PROTEIN: 7.7 g/dL (ref 6.0–8.3)
Total Bilirubin: 0.4 mg/dL (ref 0.2–1.2)

## 2014-07-06 LAB — TSH: TSH: 2.082 u[IU]/mL (ref 0.350–4.500)

## 2014-07-06 LAB — LIPID PANEL
CHOL/HDL RATIO: 5.8 ratio
CHOLESTEROL: 115 mg/dL (ref 0–200)
HDL: 20 mg/dL — ABNORMAL LOW (ref >39)
LDL Cholesterol: 63 mg/dL (ref 0–99)
Triglycerides: 161 mg/dL — ABNORMAL HIGH (ref <150)
VLDL: 32 mg/dL (ref 0–40)

## 2014-07-06 LAB — T4, FREE: Free T4: 1.21 ng/dL (ref 0.80–1.80)

## 2014-07-06 LAB — HIV ANTIBODY (ROUTINE TESTING W REFLEX): HIV 1&2 Ab, 4th Generation: NONREACTIVE

## 2014-07-06 LAB — T3: T3, Total: 141 ng/dL (ref 80.0–204.0)

## 2014-07-07 ENCOUNTER — Telehealth: Payer: Self-pay | Admitting: *Deleted

## 2014-07-07 NOTE — Telephone Encounter (Signed)
LVM to return call.

## 2014-07-07 NOTE — Telephone Encounter (Signed)
-----   Message from Lora PaulaJosalyn C Funches, MD sent at 07/06/2014  2:16 PM EST ----- Repeat thyroid studies normal. Negative screening HIV All other labs normal with very slight non-significant elevations in a few labs

## 2015-01-13 ENCOUNTER — Ambulatory Visit: Payer: Self-pay | Attending: Family Medicine

## 2016-10-09 ENCOUNTER — Ambulatory Visit: Payer: Self-pay | Attending: Family Medicine | Admitting: Family Medicine

## 2016-10-09 ENCOUNTER — Encounter: Payer: Self-pay | Admitting: Family Medicine

## 2016-10-09 VITALS — BP 97/59 | HR 74 | Temp 97.8°F | Resp 16 | Ht 64.0 in | Wt 121.8 lb

## 2016-10-09 DIAGNOSIS — K029 Dental caries, unspecified: Secondary | ICD-10-CM | POA: Insufficient documentation

## 2016-10-09 DIAGNOSIS — Z0000 Encounter for general adult medical examination without abnormal findings: Secondary | ICD-10-CM | POA: Insufficient documentation

## 2016-10-09 MED ORDER — ONE-A-DAY MENS PO TABS: 1.0000 | ORAL_TABLET | Freq: Every day | ORAL | 0 refills | Status: AC

## 2016-10-09 NOTE — Patient Instructions (Addendum)
You will be called with your labs results.   Comer de [employer] saludable con un presupuesto ajustado (Eating Healthy on a Budget) Hay muchas formas de ahorrar dinero en la tienda de comestibles y seguir comiendo de manera saludable. Usted puede lograrlo si planifica las comidas en funcin de su presupuesto, hace las compras de acuerdo con su presupuesto y lo que necesite, y prepara usted mismo las comidas. Cmo puedo comprar ms alimentos con un presupuesto limitado? Planificacin  Planifique las comidas y las colaciones de acuerdo con su lista de compras y su presupuesto.  Busque recetas que le permitan cocinar una vez y preparar una cantidad que alcance para dos comidas.  Incluya comidas, como guisos, cazuelas y [employer], que harn rendir los alimentos ms costosos.  Haga una lista de compras y llvela a la tienda. Si tiene un telfono inteligente, podra usarlo para crear su lista de compras. Compras  Cuando compre comestibles, adquiera solo los productos que figuran en la lista y vaya nicamente a las secciones de la tienda donde estn los productos que incluyo en ella. Preparacin  Algunas comidas se pueden preparar con antelacin. Cocine de [employer] de [place] extra.  Prepare comida de ms (por ejemplo, duplique las recetas) y congele lo que sobra en recipientes para una racin o en porciones individuales para comidas rpidas y colaciones.  Use las sobras en el plan de alimentacin de la semana.  Pruebe algunos platillos sin carne o comidas que no requieran coccin, como las ensaladas.  Cuando regrese a su casa de la tienda de comestibles, lave y prepare las frutas y las verduras, de modo que estn listas para usarlas y comerlas. Esto ayudar a reducir el desperdicio de la comida. Cmo puedo comprar ms alimentos con un presupuesto limitado? Ponga en prctica estos consejos la prxima vez que vaya de compras:  Compre las [address] de la tienda o las  [employer].  Use cupones solo para los alimentos y las marcas que compra habitualmente. No adquiera productos que no comprara normalmente solo porque estn en oferta.  Busque las [employer] en Internet y en los peridicos.  Compre productos saludables a granel cuando estn disponibles, por ejemplo, hierbas, especias, harinas, pastas, frutos secos y frutas disecadas.  Compre las frutas y las verduras de estacin. Generalmente, los precios son ms bajos cuando los productos son de [place].  Compare distintos productos y busque las diferencias que hay entre ellos. Para hacerlo, mire el precio por unidad que figura en la etiqueta y selo para comparar diferentes marcas y [place], y Chief Strategy Officer cul es la mejor opcin.  Elija los productos saludables que normalmente son econmicos, como las zanahorias, las papas, las [place], las bananas y las naranjas. Los porotos secos o enlatados son Neomia Dear fuente econmica de protenas.  Compre a granel y [employer] extra. Entre los productos que puede comprar a granel se incluyen las carnes, el pescado, las aves, las frutas y las verduras congeladas.  Limite la compra de alimentos preparados o listos para consumir, como las frutas y las verduras previamente cortadas, y las Proofreader.  Si es posible, compre en diferentes lugares para descubrir cul es la tienda de comestibles que ofrece los mejores precios. Algunas tiendas cobran mucho ms que otras por los mismos productos.  No haga compras cuando tenga apetito. Si lo hace, puede ser difcil atenerse a la lista y al presupuesto.  Atngase a la lista y Hydrologist a las compras compulsivas. Considere a Biomedical scientist  como el plan oficial para la semana.  Para que la compra de verduras y frutas sea variada, opte por los productos frescos, congelados y [place] Hillsenlatados.  Mire ms all de lo que tiene a nivel de Wellsite geologistla vista. Los ConocoPhillipsalimentos que estn a nivel de la vista (de un adulto o un  nio) son ms costosos. Busque las State Farmofertas en los estantes inferiores y superiores.  Cuando haga compras, no pierda tiempo, ya que cuanto ms tiempo pase en la tienda, L-3 Communicationsmayores sern las probabilidades de que gaste ms dinero.  Considere la posibilidad de visitar otras tiendas minoristas, por ejemplo, tiendas de descuentos, grandes [address]endas mayoristas, puestos locales de frutas y verduras, y Teacher, early years/premercados de Event organiseragricultores. Cules son algunos consejos respecto de los sustitutos de los alimentos menos costosos? Cuando elija alimentos ms costosos, como carnes y productos lcteos, ponga en prctica estos consejos para ahorrar dinero:  Opte por los cortes de carne ms econmicos, como los muslos y las patas de pollo con hueso, en lugar del pollo deshuesado sin piel. Cuando est listo para preparar el pollo, puede retirar usted mismo la piel para que sea ms saludable.  Escoja las carnes [place], como el pollo o [place] Islandel pavo. Cuando elija carne picada, asegrese de que sea [place] Parkmagra (92% de carne magra, 8% de grasa). Si compra carne picada ms grasosa, escurra la grasa antes de comerla.  Compre porotos y guisantes secos, como lentejas, arvejas partidas o frijoles colorados.  Para los frutos de mar, opte por el atn, el salmn o las sardinas en lata.  Los huevos son Neomia Dearuna fuente econmica de protenas.  Compre los envases ms grandes de Pharmacologistyogur en lugar de los recipientes de porciones individuales.  Opte por el agua en lugar de los refrescos y otras bebidas azucaradas.  No compre papas fritas, galletas y Liechtensteinotra comida Sports administratorchatarra. Estos productos suelen ser costosos, tienen muchas caloras y un bajo valor nutritivo. Cmo puedo preparar los alimentos que compro del modo ms saludable? Ponga en prctica estos consejos para cocinar los alimentos del modo ms saludable para reducir el consumo excesivo de grasas y caloras:  Cocine al vapor, saltee, ase u hornee los Publishing rights manageralimentos en lugar de frerlos.  Asegrese de que la  mitad del plato est ocupada por frutas o verduras. Elija entre las frutas y las verduras frescas, congeladas o Fidelityenlatadas. Si opta por las enlatadas, enjuguelas antes de comerlas. Esto eliminar el exceso de sal que se agrega para envasarlas.  Retire toda la grasa de la carne antes de cocinarla. Quite la piel del pollo o del pavo.  Con Jonathon Jordanuna cuchara, retire la grasa de las preparaciones con carne una vez que se hayan enfriado en el refrigerador y la grasa se haya solidificado en la parte superior.  Cuando prepare consoms, sopas o flanes, use PPG Industriesleche descremada, con bajo contenido de grasa o The First Americanleche descremada evaporada.  Cuando prepare salsa para untar y alios, sustituya la crema agria y la Commerce Citymayonesa por yogur con bajo contenido de [place]rasa, crema agria o queso cottage.  Haga la prueba de condimentar la comida con jugo de limn, hierbas o especias en lugar de hacerlo con sal, mantequilla o margarina. Esta informacin no tiene Theme park managercomo fin reemplazar el consejo del mdico. Asegrese de hacerle al mdico cualquier pregunta que tenga. Document Released: 02/25/2014 Document Revised: 02/25/2014 Document Reviewed: 12/14/2013 Elsevier Interactive Patient Education  2017 Arvin[employer]itorElsevier Inc.

## 2016-10-09 NOTE — Progress Notes (Signed)
Subjective:   Patient ID: [name], male    DOB: 05/31/1980, 36 y.o.   MRN: [id number]  No chief complaint on file.  HPI Jefferey Lippmann [place] 36 y.o. male presents with   Well Adult Physical: Patient here for a comprehensive physical exam. He denies any CP, SOB, BLE swelling,or  dysnpnea.The patient reports history of dental problems and dental caries. Last visit to the dentist was 1 year ago. Denies symptoms of all other pertinent systems.  No past medical history on file.  No past surgical history on file.  Family History  Problem Relation Age of Onset  . Cancer Neg Hx   . Diabetes Neg Hx   . Heart disease Neg Hx   . Hypertension Neg Hx     Social History   Social History  . Marital status: Single    Spouse name: N/A  . Number of children: 4  . Years of education: 6    Occupational History  . cleaning offices     Social History Main Topics  . Smoking status: Never Smoker  . Smokeless tobacco: Never Used  . Alcohol use No  . Drug use: No  . Sexual activity: Yes    Birth control/ protection: Condom   Other Topics Concern  . Not on file   Social History Narrative   Lives with wife and 4 children.    From [place].    Lived in [place] since 2002.        Outpatient Medications Prior to Visit  Medication Sig Dispense Refill  . Multiple Vitamin (MULTIVITAMIN) tablet Take 1 tablet by mouth daily.     No facility-administered medications prior to visit.     No Known Allergies  Review of Systems  Constitutional: Negative.   HENT:       Dental problem   Eyes: Negative.   Respiratory: Negative.   Cardiovascular: Negative.   Gastrointestinal: Negative.   Genitourinary: Negative.   Musculoskeletal: Negative.   Skin: Negative.   Neurological: Negative.   Endo/Heme/Allergies: Negative.   Psychiatric/Behavioral: Negative.       Objective:    Physical Exam  Constitutional: He is oriented to person, place, and time. He appears well-developed and  well-nourished.  HENT:  Head: Normocephalic and atraumatic.  Right Ear: External ear normal.  Left Ear: External ear normal.  Nose: Nose normal.  Mouth/Throat: Oropharynx is clear and moist. Dental caries present.  Eyes: Conjunctivae and EOM are normal. Pupils are equal, round, and reactive to light.  Neck: Normal range of motion. Neck supple.  Cardiovascular: Normal rate, regular rhythm, normal heart sounds and intact distal pulses.   Pulmonary/Chest: Effort normal and breath sounds normal.  Abdominal: Soft. Bowel sounds are normal.  Musculoskeletal: Normal range of motion.  Neurological: He is alert and oriented to person, place, and time. He has normal reflexes. No cranial nerve deficit. Coordination normal.  Skin: Skin is warm and dry.  Psychiatric: He has a normal mood and affect.  Nursing note and vitals reviewed.   BP (!) 97/59 (BP Location: Left Arm, Patient Position: Sitting, Cuff Size: Normal)   Pulse 74   Temp 97.8 F (36.6 C) (Oral)   Resp 16   Ht 5' 4" (1.626 m)   Wt 121 lb 12.8 oz (55.2 kg)   SpO2 97%   BMI 20.91 kg/m  Wt Readings from Last 3 Encounters:  10/09/16 121 lb 12.8 oz (55.2 kg)  07/05/14 117 lb (53.1 kg)    Immunization History  Administered  Date(s) Administered  . Influenza,inj,Quad PF,36+ Mos 07/05/2014    Diabetic Foot Exam - Simple   No data filed      Lab Results  Component Value Date   TSH 2.082 07/05/2014   Lab Results  Component Value Date   WBC 11.1 (H) 07/05/2014   HGB 16.0 07/05/2014   HCT 47.2 07/05/2014   MCV 89.9 07/05/2014   PLT 504 (H) 07/05/2014   Lab Results  Component Value Date   NA 137 07/05/2014   K 4.9 07/05/2014   CO2 28 07/05/2014   GLUCOSE 75 07/05/2014   BUN 17 07/05/2014   CREATININE 0.65 07/05/2014   BILITOT 0.4 07/05/2014   ALKPHOS 119 (H) 07/05/2014   AST 30 07/05/2014   ALT 42 07/05/2014   PROT 7.7 07/05/2014   ALBUMIN 4.6 07/05/2014   CALCIUM 9.6 07/05/2014   Lab Results  Component Value  Date   CHOL 115 07/05/2014   Lab Results  Component Value Date   HDL 20 (L) 07/05/2014   Lab Results  Component Value Date   LDLCALC 63 07/05/2014   Lab Results  Component Value Date   TRIG 161 (H) 07/05/2014   Lab Results  Component Value Date   CHOLHDL 5.8 07/05/2014   No results found for: HGBA1C     Assessment & Plan:   Problem List Items Addressed This Visit    None    Visit Diagnoses    Annual physical exam    -  Primary   Relevant Orders   CBC with Differential   CMP14+EGFR   TSH   Lipid Panel   Hemoglobin A1c   Vitamin D, 25-hydroxy   Ambulatory referral to Dentistry   Dental caries       Relevant Orders   Ambulatory referral to Dentistry       Meds ordered this encounter  Medications  . multivitamin (ONE-A-DAY MEN'S) TABS tablet    Sig: Take 1 tablet by mouth daily.    Refill:  0    Order Specific Question:   Supervising Provider    Answer:   Tresa Garter [[id number]]    Follow up: Return in about 1 year (around 10/09/2017) for Annual physical.    Fredia Beets, FNP

## 2016-10-10 LAB — CMP14+EGFR
A/G RATIO: 1.5 (ref 1.2–2.2)
ALBUMIN: 4.3 g/dL (ref 3.5–5.5)
ALK PHOS: 118 IU/L — AB (ref 39–117)
ALT: 27 IU/L (ref 0–44)
AST: 24 IU/L (ref 0–40)
BILIRUBIN TOTAL: 0.3 mg/dL (ref 0.0–1.2)
BUN / CREAT RATIO: 16 (ref 9–20)
BUN: 10 mg/dL (ref 6–20)
CHLORIDE: 99 mmol/L (ref 96–106)
CO2: 26 mmol/L (ref 18–29)
Calcium: 8.5 mg/dL — ABNORMAL LOW (ref 8.7–10.2)
Creatinine, Ser: 0.64 mg/dL — ABNORMAL LOW (ref 0.76–1.27)
GFR calc non Af Amer: 127 mL/min/{1.73_m2} (ref >59)
GFR, EST AFRICAN AMERICAN: 147 mL/min/{1.73_m2} (ref >59)
GLUCOSE: 66 mg/dL (ref 65–99)
Globulin, Total: 2.8 g/dL (ref 1.5–4.5)
Potassium: 4.5 mmol/L (ref 3.5–5.2)
Sodium: 138 mmol/L (ref 134–144)
TOTAL PROTEIN: 7.1 g/dL (ref 6.0–8.5)

## 2016-10-10 LAB — CBC WITH DIFFERENTIAL/PLATELET
BASOS ABS: 0 10*3/uL (ref 0.0–0.2)
Basos: 0 %
EOS (ABSOLUTE): 0.1 10*3/uL (ref 0.0–0.4)
Eos: 1 %
HEMOGLOBIN: 14.4 g/dL (ref 13.0–17.7)
Hematocrit: 42.8 % (ref 37.5–51.0)
IMMATURE GRANS (ABS): 0 10*3/uL (ref 0.0–0.1)
Immature Granulocytes: 0 %
LYMPHS ABS: 2.8 10*3/uL (ref 0.7–3.1)
LYMPHS: 33 %
MCH: 31 pg (ref 26.6–33.0)
MCHC: 33.6 g/dL (ref 31.5–35.7)
MCV: 92 fL (ref 79–97)
MONOCYTES: 9 %
Monocytes Absolute: 0.8 10*3/uL (ref 0.1–0.9)
NEUTROS ABS: 4.7 10*3/uL (ref 1.4–7.0)
Neutrophils: 57 %
Platelets: 449 10*3/uL — ABNORMAL HIGH (ref 150–379)
RBC: 4.65 x10E6/uL (ref 4.14–5.80)
RDW: 13.7 % (ref 12.3–15.4)
WBC: 8.4 10*3/uL (ref 3.4–10.8)

## 2016-10-10 LAB — HEMOGLOBIN A1C
Est. average glucose Bld gHb Est-mCnc: 114 mg/dL
Hgb A1c MFr Bld: 5.6 % (ref 4.8–5.6)

## 2016-10-10 LAB — LIPID PANEL
CHOL/HDL RATIO: 6.4 ratio — AB (ref 0.0–5.0)
Cholesterol, Total: 102 mg/dL (ref 100–199)
HDL: 16 mg/dL — AB (ref >39)
LDL Calculated: 63 mg/dL (ref 0–99)
Triglycerides: 116 mg/dL (ref 0–149)
VLDL Cholesterol Cal: 23 mg/dL (ref 5–40)

## 2016-10-10 LAB — TSH: TSH: 2.55 u[IU]/mL (ref 0.450–4.500)

## 2016-10-10 LAB — VITAMIN D 25 HYDROXY (VIT D DEFICIENCY, FRACTURES): Vit D, 25-Hydroxy: 21 ng/mL — ABNORMAL LOW (ref 30.0–100.0)

## 2016-10-23 ENCOUNTER — Telehealth: Payer: Self-pay

## 2016-10-23 NOTE — Telephone Encounter (Signed)
-----   Message from Lizbeth BarkMandesia R Hairston, OregonFNP sent at 10/18/2016  8:59 AM EDT ----- Labs that evaluated your blood cells, fluid and electrolyte balance are normal. No signs of anemia, infection, or inflammation present. Thyroid function normal. Liver function normal Kidney function normal Hgba1c that screens for diabetes is normal. You do not have diabetes. Vitamin D level was low. Vitamin D helps to keep bones strong. Recommend taking a vitamin-d supplement with 400 IU to increase your levels. Recommend follow up in 4 months. HDL cholesterol is low. Start eating a diet higher in healthy fats and increase physical activity. Good sources of healthy fats are fatty fish and olive oil.

## 2016-10-23 NOTE — Telephone Encounter (Signed)
CMA call regarding lab results   Patient did not answer but left a VM stating the reason of the call & to call back  

## 2016-11-11 ENCOUNTER — Ambulatory Visit: Payer: Self-pay

## 2016-11-15 ENCOUNTER — Ambulatory Visit: Payer: Self-pay | Attending: Family Medicine

## 2020-10-09 ENCOUNTER — Emergency Department (HOSPITAL_COMMUNITY)
Admission: EM | Admit: 2020-10-09 | Discharge: 2020-10-10 | Disposition: A | Discharge status: Home / Self Care | Payer: Self-pay | Attending: Emergency Medicine | Admitting: Emergency Medicine

## 2020-10-09 ENCOUNTER — Encounter (HOSPITAL_COMMUNITY): Payer: Self-pay

## 2020-10-09 ENCOUNTER — Emergency Department (HOSPITAL_COMMUNITY): Payer: Self-pay

## 2020-10-09 DIAGNOSIS — R079 Chest pain, unspecified: Secondary | ICD-10-CM | POA: Insufficient documentation

## 2020-10-09 DIAGNOSIS — Z5321 Procedure and treatment not carried out due to patient leaving prior to being seen by health care provider: Secondary | ICD-10-CM | POA: Insufficient documentation

## 2020-10-09 DIAGNOSIS — R110 Nausea: Secondary | ICD-10-CM | POA: Insufficient documentation

## 2020-10-09 LAB — BASIC METABOLIC PANEL
Anion gap: 6 (ref 5–15)
BUN: 9 mg/dL (ref 6–20)
CO2: 25 mmol/L (ref 22–32)
Calcium: 8.5 mg/dL — ABNORMAL LOW (ref 8.9–10.3)
Chloride: 104 mmol/L (ref 98–111)
Creatinine, Ser: 0.68 mg/dL (ref 0.61–1.24)
GFR, Estimated: >60 mL/min (ref >60)
Glucose, Bld: 94 mg/dL (ref 70–99)
Potassium: 3.5 mmol/L (ref 3.5–5.1)
Sodium: 135 mmol/L (ref 135–145)

## 2020-10-09 LAB — CBC
HCT: 36.3 % — ABNORMAL LOW (ref 39.0–52.0)
Hemoglobin: 12.7 g/dL — ABNORMAL LOW (ref 13.0–17.0)
MCH: 40.7 pg — ABNORMAL HIGH (ref 26.0–34.0)
MCHC: 35 g/dL (ref 30.0–36.0)
MCV: 116.3 fL — ABNORMAL HIGH (ref 80.0–100.0)
Platelets: 308 10*3/uL (ref 150–400)
RBC: 3.12 MIL/uL — ABNORMAL LOW (ref 4.22–5.81)
RDW: 15.8 % — ABNORMAL HIGH (ref 11.5–15.5)
WBC: 8.5 10*3/uL (ref 4.0–10.5)
nRBC: 0 % (ref 0.0–0.2)

## 2020-10-09 LAB — TROPONIN I (HIGH SENSITIVITY): Troponin I (High Sensitivity): <2 ng/L (ref <18)

## 2020-10-09 NOTE — ED Triage Notes (Signed)
Pt reports that about an hour ago he had sudden onset of CP with radiation to his arm and nausea

## 2020-10-09 NOTE — ED Provider Notes (Signed)
Emergency Medicine Provider Triage Evaluation Note  [name] , a 40 y.o. male  was evaluated in triage.  Pt complains of chest pain of sudden onset about 1 hour prior to arrival in the ED. He felt this was sharp to the substernal region and accompanied by left arm tingling sensation. +nausea. No prior hx of cardiac disease. No family hx of CAD. Symptoms have resolved.  Review of Systems  Positive: Chest pain, shortness of breath, nausea Negative: Fever, cough, leg swelling  Physical Exam  There were no vitals taken for this visit. Gen:   Awake, no distress   Resp:  Normal effort  MSK:   Moves extremities without difficulty  Other:    Medical Decision Making  Medically screening exam initiated at 9:52 PM.  Appropriate orders placed.  [name] was informed that the remainder of the evaluation will be completed by another provider, this initial triage assessment does not replace that evaluation, and the importance of remaining in the ED until their evaluation is complete.  Patient with no PMH here with sudden onset of chest pain with radiation down the left arm, this has now subsided. No prior CAD hx or family HX. Non smoker.    [name], PA-C 10/09/20 2154    [name], MD 10/10/20 [phone]

## 2020-10-10 LAB — TROPONIN I (HIGH SENSITIVITY): Troponin I (High Sensitivity): <2 ng/L (ref <18)

## 2020-10-10 NOTE — ED Notes (Signed)
LWBS 

## 2020-10-17 ENCOUNTER — Ambulatory Visit: Payer: Self-pay | Attending: Family Medicine | Admitting: Family Medicine

## 2020-10-17 ENCOUNTER — Other Ambulatory Visit: Payer: Self-pay

## 2020-10-17 ENCOUNTER — Encounter: Payer: Self-pay | Admitting: Family Medicine

## 2020-10-17 VITALS — BP 96/60 | HR 65 | Ht 64.0 in | Wt 115.4 lb

## 2020-10-17 DIAGNOSIS — R202 Paresthesia of skin: Secondary | ICD-10-CM

## 2020-10-17 MED ORDER — LIDOCAINE 5 % EX PTCH
1.0000 | MEDICATED_PATCH | CUTANEOUS | 0 refills | Status: AC
  Filled 2020-10-17: qty 30, 30d supply, fill #0

## 2020-10-17 NOTE — Progress Notes (Signed)
Wants to know results from ED. Pain in neck.

## 2020-10-17 NOTE — Progress Notes (Signed)
Subjective:  Patient ID: [name], male    DOB: [birth year]  Age: 40 y.o. MRN: [id number]  CC: Hospitalization Follow-up   HPI [name] is a 40 year old male who presents today to establish care accompanied by an in person Spanish interpreter.  He was last seen in the clinic 3 years ago. He had an ED visit for chest pain 1 week ago.  Interval History: He is asking about results of his evaluation at the ED. The chest pain he had prior to presenting to the ED has resolved.  Chest x-ray was normal, troponin negative and he was ruled out for ACS.  Labs revealed mild anemia. Complains of posterior neck numbness which has been present since he got out from the ED. Denies radiation of symptoms and symptoms are wose when he extends his neck  No past medical history on file.  No past surgical history on file.  Family History  Problem Relation Age of Onset  . Cancer Neg Hx   . Diabetes Neg Hx   . Heart disease Neg Hx   . Hypertension Neg Hx     No Known Allergies  Outpatient Medications Prior to Visit  Medication Sig Dispense Refill  . multivitamin (ONE-A-DAY MEN'S) TABS tablet Take 1 tablet by mouth daily. (Patient not taking: Reported on 10/17/2020)  0   No facility-administered medications prior to visit.     ROS Review of Systems  Constitutional: Negative for activity change and appetite change.  HENT: Negative for sinus pressure and sore throat.   Eyes: Negative for visual disturbance.  Respiratory: Negative for cough, chest tightness and shortness of breath.   Cardiovascular: Negative for chest pain and leg swelling.  Gastrointestinal: Negative for abdominal distention, abdominal pain, constipation and diarrhea.  Endocrine: Negative.   Genitourinary: Negative for dysuria.  Musculoskeletal: Negative for joint swelling and myalgias.  Skin: Negative for rash.  Allergic/Immunologic: Negative.   Neurological: Negative for weakness, light-headedness and  numbness.  Psychiatric/Behavioral: Negative for dysphoric mood and suicidal ideas.    Objective:  BP 96/60   Pulse 65   Ht 5\' 4"  (1.626 m)   Wt 115 lb 6.4 oz (52.3 kg)   SpO2 99%   BMI 19.81 kg/m   BP/Weight 10/17/2020 10/10/2020 10/09/2016  Systolic BP 96 100 97  Diastolic BP 60 56 59  Wt. (Lbs) 115.4 - 121.8  BMI 19.81 - 20.91      Physical Exam Constitutional:      Appearance: He is well-developed.  Neck:     Vascular: No JVD.  Cardiovascular:     Rate and Rhythm: Normal rate.     Heart sounds: Normal heart sounds. No murmur heard.   Pulmonary:     Effort: Pulmonary effort is normal.     Breath sounds: Normal breath sounds. No wheezing or rales.  Chest:     Chest wall: No tenderness.  Abdominal:     General: Bowel sounds are normal. There is no distension.     Palpations: Abdomen is soft. There is no mass.     Tenderness: There is no abdominal tenderness.  Musculoskeletal:        General: Normal range of motion.     Cervical back: No rigidity or tenderness.     Right lower leg: No edema.     Left lower leg: No edema.     Comments: Normal handgrip bilaterally.  Neurological:     Mental Status: He is alert and oriented to person,  place, and time.  Psychiatric:        Mood and Affect: Mood normal.     CMP Latest Ref Rng & Units 10/09/2020 10/09/2016 07/05/2014  Glucose 70 - 99 mg/dL 94 66 75  BUN 6 - 20 mg/dL 9 10 17   Creatinine 0.61 - 1.24 mg/dL 7.51) 0.25(E  Sodium 135 - 145 mmol/L 135 138 137  Potassium 3.5 - 5.1 mmol/L 3.5 4.5 4.9  Chloride 98 - 111 mmol/L 104 99 101  CO2 22 - 32 mmol/L 25 26 28   Calcium 8.9 - 10.3 mg/dL 5.27) ) 9.6  Total Protein 6.0 - 8.5 g/dL - 7.1 7.7  Total Bilirubin 0.0 - 1.2 mg/dL - 0.3 0.4  Alkaline Phos 39 - 117 IU/L - 118(H) 119(H)  AST 0 - 40 IU/L - 24 30  ALT 0 - 44 IU/L - 27 42    Lipid Panel     Component Value Date/Time   CHOL 102 10/09/2016 1046   TRIG 116 10/09/2016 1046   HDL 16 (L) 10/09/2016 1046    CHOLHDL 6.4 (H) 10/09/2016 1046   CHOLHDL 5.8 07/05/2014 1426   VLDL 32 07/05/2014 1426   LDLCALC 63 10/09/2016 1046    CBC    Component Value Date/Time   WBC 8.5 10/09/2020 2153   RBC 3.12 (L) 10/09/2020 2153   HGB 12.7 (L) 10/09/2020 2153   HGB 14.4 10/09/2016 1046   HCT 36.3 (L) 10/09/2020 2153   HCT 42.8 10/09/2016 1046   PLT 308 10/09/2020 2153   PLT 449 (H) 10/09/2016 1046   MCV 116.3 (H) 10/09/2020 2153   MCV 92 10/09/2016 1046   MCH 40.7 (H) 10/09/2020 2153   MCHC 35.0 10/09/2020 2153   RDW 15.8 (H) 10/09/2020 2153   RDW 13.7 10/09/2016 1046   LYMPHSABS 2.8 10/09/2016 1046   MONOABS 0.6 09/16/2010 0610   EOSABS 0.1 10/09/2016 1046   BASOSABS 0.0 10/09/2016 1046    Lab Results  Component Value Date   HGBA1C 5.6 10/09/2016    Assessment & Plan:  1. Paresthesia Could be positional He has no neck pain and exam findings are negative for cervical radiculopathy. Advised to apply heat - lidocaine (LIDODERM) 5 %; Place 1 patch onto the skin daily. Remove & Discard patch within 12 hours or as directed by MD  Dispense: 30 patch; Refill: 0    Meds ordered this encounter  Medications  . lidocaine (LIDODERM) 5 %    Sig: Place 1 patch onto the skin daily. Remove & Discard patch within 12 hours or as directed by MD    Dispense:  30 patch    Refill:  0    Follow-up: Return if symptoms worsen or fail to improve.       10/11/2016, MD, FAAFP. [facility] [place], [facility] [place]   10/17/2020, 5:48 PM

## 2020-10-17 NOTE — Patient Instructions (Signed)
Parestesia Paresthesia La parestesia es una sensacin de ardor o picazn. Esta sensacin puede aparecer en cualquier parte del cuerpo. Suele manifestarse en las manos, los brazos, las piernas o los pies. Por lo general, no es dolorosa. En la mayora de los casos, la sensacin desaparece al poco tiempo y no es un signo de un problema grave. Si tiene parestesia por mucho tiempo, es necesario que lo examine el mdico. Siga estas instrucciones en su casa: Consumo de alcohol  No beba alcohol si: ? El mdico le indica que no lo haga. ? Est embarazada, puede estar embarazada o est tratando de quedar embarazada.  Si bebe alcohol: ? Limite la cantidad que bebe:  De 0 a 1 medida por da para las mujeres.  De 0 a 2 medidas por da para los hombres. ? Est atento a la cantidad de alcohol que hay en las bebidas que toma. En los [place], una medida equivale a una botella de cerveza de 12oz (355ml), un vaso de vino de 5oz (148ml) o un vaso de una bebida alcohlica de alta graduacin de 1oz (44ml).   Nutricin  Siga una dieta saludable. Esto incluye: ? Consumir alimentos con alto contenido de fibra, como frutas y verduras frescas, cereales integrales y frijoles. ? Limitar el consumo de alimentos que contienen gran cantidad de grasas y azcares procesados, como los alimentos fritos o dulces.   Indicaciones generales  Use los medicamentos de venta libre y los recetados solamente como se lo haya indicado el mdico.  No consuma ningn producto que contenga nicotina o tabaco, como cigarrillos y cigarrillos electrnicos. Si necesita ayuda para dejar de consumir estos productos, consulte al mdico.  Si tiene diabetes, colabore con el mdico para asegurarse de mantener el nivel de azcar en sangre dentro de un rango saludable.  Si siente adormecimiento en los pies: ? Controle si tiene enrojecimiento, calor e hinchazn todos los das. ? Use calcetines acolchados y zapatos cmodos. Estos ayudan  a proteger los pies.  Concurra a todas visitas de seguimiento como se lo haya indicado el mdico. Esto es importante. Comunquese con un mdico si:  Sus sntomas de parestesia empeoran o no desaparecen.  Pierde la sensibilidad (tiene adormecimiento) despus de una lesin.  La sensacin de ardor o picazn empeora al caminar.  Siente dolor o tiene calambres.  Siente mareos o se desmaya.  Tiene una erupcin cutnea. Solicite ayuda de inmediato si:  Se siente dbil o comienza a tener debilidad en un brazo o una pierna.  Tiene dificultad para caminar o moverse.  Tiene problemas para hablar, comprender o ver.  Se siente confundido.  No puede controlar la orina (miccin) ni las evacuaciones intestinales (deposiciones). Resumen  La parestesia es una sensacin de ardor o picazn. Suele manifestarse en las manos, los brazos, las piernas o los pies.  En la mayora de los casos, la sensacin desaparece al poco tiempo y no es un signo de un problema grave.  Si tiene parestesia por mucho tiempo, es necesario que lo examine el mdico. Esta informacin no tiene como fin reemplazar el consejo del mdico. Asegrese de hacerle al mdico cualquier pregunta que tenga. Document Revised: 03/03/2020 Document Reviewed: 03/03/2020 Elsevier Patient Education  2021 Elsevier Inc.  

## 2020-11-30 ENCOUNTER — Telehealth: Payer: Self-pay | Admitting: *Deleted

## 2020-11-30 ENCOUNTER — Ambulatory Visit: Payer: Self-pay

## 2020-11-30 NOTE — Telephone Encounter (Signed)
Returned call, still no answer, no voicemail set up. Left night time number to call if still has questions.

## 2020-11-30 NOTE — Telephone Encounter (Signed)
Patient called, no answer, mailbox not set up.    Message from Valora Piccolo sent at 11/30/2020  4:25 PM EDT  Pts wife is calling back regarding pts neck pain. Pt was seen in Office for Paresthesia - while in the office -  and he was ruled out for ACS.  Labs revealed mild anemia.  Complains of posterior neck numbness which has been present since he got out from the ED. Denies radiation of symptoms and symptoms are wose when he extends his neck

## 2020-12-01 NOTE — Telephone Encounter (Signed)
Using [employer] for [employer] [phone] (same first name as pt) I attempted to return call.   No answer got message call unable to be placed and no voicemail set up yet.   This is the third attempt [facility] has made to return call to this pt without success.   He had called in c/o neck pain on 11/30/2020.  [facility] triage nurses have called him a total of 3 times trying to discuss his neck pain issue, be triaged without success.   He was seen by Dr. Alvis Lemmings at [facility] for this issue and ED follow up on 10/17/2020.   Neck pain mentioned during this visit with Dr. Alvis Lemmings but it's worse now.    I have forwarded my notes to Dr. Alvis Lemmings at [facility] for follow up since we were unable to get in touch with him per our protocol.

## 2020-12-01 NOTE — Telephone Encounter (Signed)
Call forwarded to office- [facility]

## 2020-12-01 NOTE — Telephone Encounter (Signed)
See documentation note.   3 attempts made to call pt without success by the [facility] triage nurses.   He had called in on 11/30/2020 c/o neck pain.  Information forwarded to [facility] for Dr. Alvis Lemmings.

## 2020-12-01 NOTE — Telephone Encounter (Signed)
Please schedule patient for an appointment with any available provider.

## 2020-12-04 ENCOUNTER — Ambulatory Visit: Payer: Self-pay

## 2020-12-04 NOTE — Telephone Encounter (Signed)
Answer Assessment - Initial Assessment Questions 1. DESCRIPTION: "Tell me more about how you (your loved one; patient) are feeling." (e.g., tired, exhausted, lacks energy, feels physically weak)      Feels tired 2. SEVERITY: "How bad is it?"  "How is the weakness or fatigue affecting your ability to do your usual activities?"      - MILD (0-3): Feels weak or tired, but able to engage in most of usual daily activities     - MODERATE (4-7): Able to stand and walk; weakness or fatigue significantly interferes with daily activities     - SEVERE (8-10): Unable to stand or walk; unable to do usual activities     Moderate 3. ONSET:  "When did these symptoms begin?" (e.g., hours, days, weeks, months)     Several weeks 4. CAUSE: "What do you think is causing the weakness or fatigue?" (e.g., poor appetite, not drinking enough fluids, trouble sleeping, a new medicine)     Unsure 5. OTHER SYMPTOMS: "Do you have any other symptoms?" (e.g., bleeding, chest pain, diarrhea, fever, cough, SOB, vomiting)     Shortness of breath, numbness to neck 6. NEW MEDICATIONS:  "Have you started on any new medicines recently?" (e.g., opioid pain medicines, benzodiazepines, muscle relaxants, antidepressants, antihistamines, neuroleptics, beta blockers)     No 7. TREATMENT: "Are you taking any medicines to treat weakness or fatigue?"  (e.g., corticosteroids, stimulants, nutritional supplements, alternative therapies)     No 8. CALLER's COPING: "How are you doing?" "How are other family members and loved ones doing?"     No  Answer Assessment - Initial Assessment Questions 1. DESCRIPTION: "Describe how you are feeling."     Weak, tired 2. SEVERITY: "How bad is it?"  "Can you stand and walk?"   - MILD - Feels weak or tired, but does not interfere with work, school or normal activities   - MODERATE - Able to stand and walk; weakness interferes with work, school, or normal activities   - SEVERE - Unable to stand or walk      Moderate 3. ONSET:  "When did the weakness begin?"     Several weeks ago 4. CAUSE: "What do you think is causing the weakness?"     Unsure 5. MEDICINES: "Have you recently started a new medicine or had a change in the amount of a medicine?"     No 6. OTHER SYMPTOMS: "Do you have any other symptoms?" (e.g., chest pain, fever, cough, SOB, vomiting, diarrhea, bleeding, other areas of pain)     Shortness of breath, dizziness 7. PREGNANCY: "Is there any chance you are pregnant?" "When was your last menstrual period?"     N/a  Protocols used: Hospice - Weakness (Generalized) and Fatigue-A-AH, Weakness (Generalized) and Fatigue-A-AH

## 2020-12-04 NOTE — Telephone Encounter (Signed)
Please schedule patient an appointment.  

## 2020-12-04 NOTE — Telephone Encounter (Signed)
Using Spanish interpreter Byrd Hesselbach.# [id number]. Pt. Reports he has been feeling bad for several weeks, since being in the hospital. Has weakness, dizziness, shortness of breath that comes and goes. Also continues  to have numbness to neck that he has been seen for. States "maybe I need to be on iron." No availability in the practice today. Please advise pt.

## 2022-01-25 ENCOUNTER — Other Ambulatory Visit: Payer: Self-pay

## 2023-10-12 ENCOUNTER — Encounter (HOSPITAL_COMMUNITY): Payer: Self-pay

## 2023-10-12 ENCOUNTER — Other Ambulatory Visit: Payer: Self-pay

## 2023-10-12 ENCOUNTER — Emergency Department (HOSPITAL_COMMUNITY)
Admission: EM | Admit: 2023-10-12 | Discharge: 2023-10-12 | Disposition: A | Discharge status: Home / Self Care | Payer: Self-pay | Attending: Emergency Medicine | Admitting: Emergency Medicine

## 2023-10-12 ENCOUNTER — Emergency Department (HOSPITAL_COMMUNITY): Payer: Self-pay

## 2023-10-12 DIAGNOSIS — R197 Diarrhea, unspecified: Secondary | ICD-10-CM | POA: Insufficient documentation

## 2023-10-12 DIAGNOSIS — E871 Hypo-osmolality and hyponatremia: Secondary | ICD-10-CM | POA: Insufficient documentation

## 2023-10-12 DIAGNOSIS — R110 Nausea: Secondary | ICD-10-CM | POA: Insufficient documentation

## 2023-10-12 DIAGNOSIS — D72829 Elevated white blood cell count, unspecified: Secondary | ICD-10-CM | POA: Insufficient documentation

## 2023-10-12 DIAGNOSIS — R1084 Generalized abdominal pain: Secondary | ICD-10-CM | POA: Insufficient documentation

## 2023-10-12 LAB — URINALYSIS, ROUTINE W REFLEX MICROSCOPIC
Bacteria, UA: NONE SEEN
Bilirubin Urine: NEGATIVE
Glucose, UA: NEGATIVE mg/dL
Ketones, ur: NEGATIVE mg/dL
Leukocytes,Ua: NEGATIVE
Nitrite: NEGATIVE
Protein, ur: NEGATIVE mg/dL
Specific Gravity, Urine: 1.013 (ref 1.005–1.030)
pH: 5 (ref 5.0–8.0)

## 2023-10-12 LAB — COMPREHENSIVE METABOLIC PANEL WITH GFR
ALT: 31 U/L (ref 0–44)
AST: 27 U/L (ref 15–41)
Albumin: 3.6 g/dL (ref 3.5–5.0)
Alkaline Phosphatase: 94 U/L (ref 38–126)
Anion gap: 10 (ref 5–15)
BUN: 7 mg/dL (ref 6–20)
CO2: 20 mmol/L — ABNORMAL LOW (ref 22–32)
Calcium: 8.6 mg/dL — ABNORMAL LOW (ref 8.9–10.3)
Chloride: 100 mmol/L (ref 98–111)
Creatinine, Ser: 1.16 mg/dL (ref 0.61–1.24)
GFR, Estimated: >60 mL/min (ref >60)
Glucose, Bld: 124 mg/dL — ABNORMAL HIGH (ref 70–99)
Potassium: 3.9 mmol/L (ref 3.5–5.1)
Sodium: 130 mmol/L — ABNORMAL LOW (ref 135–145)
Total Bilirubin: 0.6 mg/dL (ref 0.0–1.2)
Total Protein: 7.4 g/dL (ref 6.5–8.1)

## 2023-10-12 LAB — LIPASE, BLOOD: Lipase: 38 U/L (ref 11–51)

## 2023-10-12 LAB — CBC
HCT: 41.6 % (ref 39.0–52.0)
Hemoglobin: 14.7 g/dL (ref 13.0–17.0)
MCH: 36.8 pg — ABNORMAL HIGH (ref 26.0–34.0)
MCHC: 35.3 g/dL (ref 30.0–36.0)
MCV: 104.3 fL — ABNORMAL HIGH (ref 80.0–100.0)
Platelets: 376 10*3/uL (ref 150–400)
RBC: 3.99 MIL/uL — ABNORMAL LOW (ref 4.22–5.81)
RDW: 14.1 % (ref 11.5–15.5)
WBC: 12.6 10*3/uL — ABNORMAL HIGH (ref 4.0–10.5)
nRBC: 0.4 % — ABNORMAL HIGH (ref 0.0–0.2)

## 2023-10-12 LAB — C DIFFICILE QUICK SCREEN W PCR REFLEX
C Diff antigen: NEGATIVE
C Diff interpretation: NOT DETECTED
C Diff toxin: NEGATIVE

## 2023-10-12 MED ORDER — SODIUM CHLORIDE 0.9 % IV BOLUS
1000.0000 mL | Freq: Once | INTRAVENOUS | Status: AC
  Administered 2023-10-12: 1000 mL via INTRAVENOUS

## 2023-10-12 MED ORDER — ONDANSETRON HCL 4 MG/2ML IJ SOLN
4.0000 mg | Freq: Once | INTRAMUSCULAR | Status: AC
  Administered 2023-10-12: 4 mg via INTRAVENOUS
  Filled 2023-10-12: qty 2

## 2023-10-12 MED ORDER — IOPAMIDOL (ISOVUE-370) INJECTION 76%
75.0000 mL | Freq: Once | INTRAVENOUS | Status: AC | PRN
  Administered 2023-10-12: 75 mL via INTRAVENOUS

## 2023-10-12 NOTE — ED Triage Notes (Signed)
 Pt arrived POV from home c/o generalized abdominal pain since Thursday, nausea, vomiting and diarrhea.

## 2023-10-12 NOTE — ED Provider Notes (Signed)
  Physical Exam  BP 109/65   Pulse 84   Temp 98.1 F (36.7 C) (Oral)   Resp 16   SpO2 100%   Physical Exam Vitals and nursing note reviewed.  Constitutional:      Appearance: He is well-developed.  HENT:     Head: Normocephalic and atraumatic.  Cardiovascular:     Rate and Rhythm: Normal rate.  Pulmonary:     Effort: Pulmonary effort is normal.     Breath sounds: No wheezing.  Abdominal:     General: Abdomen is flat.     Palpations: Abdomen is soft.  Neurological:     Mental Status: He is alert.     Procedures  Procedures  ED Course / MDM    Medical Decision Making Amount and/or Complexity of Data Reviewed Labs: ordered. Radiology: ordered.  Risk Prescription drug management.   Patient care assumed from [place]. PA at shift change, please see his note for full HPI. Briefly, patient here with generalized abdominal pain but also felt his heart racing, felt that he was may be dehydrated.  Blood pressure was slightly soft here, received a liter bolus, along with Zofran .  Did have some blood present in his urine, will need CT abdomen and pelvis to rule out any acute process.  Plan is for CT and likely disposition home after PO challenge.   11:25 AM CT abdomen and pelvis without any acute finding, patient was reassessed by me, blood pressure continues to be systolic in the 90s, heart rate slightly elevated still, he reports multiple episodes diarrhea however there has not been any blood.  He did have a fever for one of the days however this later resolved.  We discussed additional bolus.  11:27 AM after 2 L patient's blood pressure now around the 110s systolic, he did provide a specimen of stool while in the ED, I did discuss with him no need for antibiotics at this time as symptoms have been ongoing for 3 days, no blood in his stool, no fevers.  He is agreeable to plan and treatment, will continue supportive treatment.  Return precaution discussed at length.  Patient stable  for discharge.  Portions of this note were generated with Scientist, clinical (histocompatibility and immunogenetics). Dictation errors may occur despite best attempts at proofreading.      Jadon Ressler, PA-C 10/12/23 1127    Edson Graces, MD 10/13/23 [phone]

## 2023-10-12 NOTE — Discharge Instructions (Addendum)
 Los resultados de sus examenes salieron normales. Continue tomando gatorade y agua para ayudar con sus simptomas.  Regrese a la sala de Associate Professor, si ve sangre porfavor regrese a Radio broadcast assistant.

## 2023-10-12 NOTE — ED Provider Notes (Signed)
 [place] EMERGENCY DEPARTMENT AT [facility] Provider Note   CSN: [id number] Arrival date & time: 10/12/23  0433     History  Chief Complaint  Patient presents with   Abdominal Pain    [name] is a 43 y.o. male.  Patient presents to the emergency department complaining of frequent episodes of diarrhea beginning on Thursday with nausea and no vomiting.  He also complains of generalized abdominal pain.  Patient feels that his heart was racing earlier this evening and is worried that he may be dehydrated.  He denies chest pain, shortness of breath.  He endorses subjective fever at home, chills.  This evening he took Advil PM prior to coming to the emergency department.  No relevant past medical history on file.  Family at bedside acting as interpreter at patient's request.  Abdominal Pain      Home Medications Prior to Admission medications   Medication Sig Start Date End Date Taking? Authorizing Provider  lidocaine  (LIDODERM ) 5 % Place 1 patch onto the skin daily. Remove & Discard patch within 12 hours or as directed by MD 10/17/20   Newlin, Enobong, MD  multivitamin (ONE-A-DAY MEN'S) TABS tablet Take 1 tablet by mouth daily. Patient not taking: Reported on 10/17/2020 10/09/16   Hairston, Mandesia R, FNP      Allergies    Patient has no known allergies.    Review of Systems   Review of Systems  Gastrointestinal:  Positive for abdominal pain.    Physical Exam Updated Vital Signs BP 101/68   Pulse 82   Temp 97.7 F (36.5 C)   Resp 15   SpO2 99%  Physical Exam Vitals and nursing note reviewed.  Constitutional:      General: He is not in acute distress.    Appearance: He is well-developed.  HENT:     Head: Normocephalic and atraumatic.  Eyes:     Conjunctiva/sclera: Conjunctivae normal.  Cardiovascular:     Rate and Rhythm: Normal rate and regular rhythm.     Heart sounds: No murmur heard. Pulmonary:     Effort: Pulmonary effort is normal.  No respiratory distress.     Breath sounds: Normal breath sounds.  Abdominal:     Palpations: Abdomen is soft.     Tenderness: There is abdominal tenderness.  Musculoskeletal:        General: No swelling.     Cervical back: Neck supple.  Skin:    General: Skin is warm and dry.     Capillary Refill: Capillary refill takes less than 2 seconds.  Neurological:     Mental Status: He is alert.  Psychiatric:        Mood and Affect: Mood normal.     ED Results / Procedures / Treatments   Labs (all labs ordered are listed, but only abnormal results are displayed) Labs Reviewed  COMPREHENSIVE METABOLIC PANEL WITH GFR - Abnormal; Notable for the following components:      Result Value   Sodium 130 (*)    CO2 20 (*)    Glucose, Bld 124 (*)    Calcium 8.6 (*)    All other components within normal limits  CBC - Abnormal; Notable for the following components:   WBC 12.6 (*)    RBC 3.99 (*)    MCV 104.3 (*)    MCH 36.8 (*)    nRBC 0.4 (*)    All other components within normal limits  URINALYSIS, ROUTINE W REFLEX MICROSCOPIC -  Abnormal; Notable for the following components:   Hgb urine dipstick MODERATE (*)    All other components within normal limits  C DIFFICILE QUICK SCREEN W PCR REFLEX    GASTROINTESTINAL PANEL BY PCR, STOOL (REPLACES STOOL CULTURE)  LIPASE, BLOOD    EKG None  Radiology No results found.  Procedures Procedures    Medications Ordered in ED Medications  sodium chloride 0.9 % bolus 1,000 mL (1,000 mLs Intravenous New Bag/Given 10/12/23 0605)  ondansetron (ZOFRAN) injection 4 mg (4 mg Intravenous Given 10/12/23 0605)    ED Course/ Medical Decision Making/ A&P                                 Medical Decision Making Amount and/or Complexity of Data Reviewed Labs: ordered. Radiology: ordered.  Risk Prescription drug management.   This patient presents to the ED for concern of abdominal pain with nausea and diarrhea, this involves an extensive number  of treatment options, and is a complaint that carries with it a high risk of complications and morbidity.  The differential diagnosis includes gastroenteritis, infectious diarrhea, nephrolithiasis, cholecystitis, appendicitis, others   Co morbidities that complicate the patient evaluation  None   Additional history obtained:  Additional history obtained from family at bedside   Lab Tests:  I Ordered, and personally interpreted labs.  The pertinent results include: White count of 12,600, UA with moderate hemoglobin, lipase 38   Imaging Studies ordered:  I ordered imaging studies including CT abdomen pelvis with contrast Imaging pending   Cardiac Monitoring: / EKG:  The patient was maintained on a cardiac monitor.  I personally viewed and interpreted the cardiac monitored which showed an underlying rhythm of: Sinus rhythm   Problem List / ED Course / Critical interventions / Medication management   I ordered medication including saline bolus for fluid resuscitation, Zofran for nausea Reevaluation of the patient after these medicines showed that the patient improved I have reviewed the patients home medicines and have made adjustments as needed  Social Determinants of Health:  Patient is self-pay   Test / Admission - Considered:  Patient with generalized abdominal pain, nausea, diarrhea.  CT scan pending at time of shift handoff.  Concern for possible kidney stone with hemoglobin on UA.  Patient also has a leukocytosis.  Patient care being transferred to [name], PA-C at shift handoff.  Disposition pending results of imaging and reassessment.  C. difficile and stool panel have been ordered.  If patient is able to provide sample will send to lab for further evaluation.         Final Clinical Impression(s) / ED Diagnoses Final diagnoses:  Generalized abdominal pain  Nausea  Diarrhea, unspecified type    Rx / DC Orders ED Discharge Orders     None          Delories Fetter 10/12/23 2130    Edson Graces, MD 10/12/23 [phone]

## 2023-10-12 NOTE — ED Notes (Signed)
 Ambulatory to restroom with steady gait.

## 2024-03-16 ENCOUNTER — Other Ambulatory Visit: Payer: Self-pay | Admitting: Family

## 2024-03-16 ENCOUNTER — Ambulatory Visit
Admission: RE | Admit: 2024-03-16 | Discharge: 2024-03-16 | Disposition: A | Discharge status: Home / Self Care | Payer: PRIVATE HEALTH INSURANCE | Source: Ambulatory Visit | Attending: Occupational Medicine | Admitting: Occupational Medicine

## 2024-03-16 ENCOUNTER — Other Ambulatory Visit: Payer: Self-pay | Admitting: Occupational Medicine

## 2024-03-16 DIAGNOSIS — S99921A Unspecified injury of right foot, initial encounter: Secondary | ICD-10-CM

## 2024-03-16 DIAGNOSIS — S99912A Unspecified injury of left ankle, initial encounter: Secondary | ICD-10-CM
